# Patient Record
Sex: Female | Born: 1985 | Race: Black or African American | Hispanic: No | Marital: Married | Smoking: Never smoker
Health system: Southern US, Community
[De-identification: ages and names within clinical notes are randomized; demographics above are authoritative.]

## PROBLEM LIST (undated history)

## (undated) DIAGNOSIS — D649 Anemia, unspecified: Secondary | ICD-10-CM

## (undated) HISTORY — PX: DILATION AND CURETTAGE OF UTERUS: SHX78

---

## 2011-12-12 DIAGNOSIS — Z9889 Other specified postprocedural states: Secondary | ICD-10-CM

## 2012-05-20 HISTORY — PX: DILATION AND EVACUATION: SHX1459

## 2014-02-02 ENCOUNTER — Encounter (HOSPITAL_COMMUNITY): Payer: Self-pay | Admitting: Emergency Medicine

## 2014-02-02 ENCOUNTER — Emergency Department (INDEPENDENT_AMBULATORY_CARE_PROVIDER_SITE_OTHER)
Admission: EM | Admit: 2014-02-02 | Discharge: 2014-02-02 | Disposition: A | Discharge status: Home / Self Care | Payer: BC Managed Care – PPO | Source: Home / Self Care

## 2014-02-02 ENCOUNTER — Emergency Department (INDEPENDENT_AMBULATORY_CARE_PROVIDER_SITE_OTHER): Payer: BC Managed Care – PPO

## 2014-02-02 DIAGNOSIS — R0789 Other chest pain: Secondary | ICD-10-CM

## 2014-02-02 DIAGNOSIS — R079 Chest pain, unspecified: Secondary | ICD-10-CM

## 2014-02-02 DIAGNOSIS — K219 Gastro-esophageal reflux disease without esophagitis: Secondary | ICD-10-CM

## 2014-02-02 MED ORDER — GI COCKTAIL ~~LOC~~
ORAL | Status: AC
  Filled 2014-02-02: qty 30

## 2014-02-02 MED ORDER — GI COCKTAIL ~~LOC~~
30.0000 mL | Freq: Once | ORAL | Status: AC
  Administered 2014-02-02: 30 mL via ORAL

## 2014-02-02 MED ORDER — PREDNISONE 10 MG PO TABS: ORAL_TABLET | ORAL | Status: DC

## 2014-02-02 NOTE — ED Provider Notes (Signed)
CSN: [id number]     Arrival date & time 02/02/14  1952 History   None    Chief Complaint  Patient presents with  . Chest Pain     (Consider location/radiation/quality/duration/timing/severity/associated sxs/prior Treatment) HPI Comments: 28 yo AAF presents with Chest pain since this a.m. She notes pain is mid-sternal 5/10 without radiation. She notes occasional reflux and mild stress. She has been belching a little more and denies relief with TUMS.She denies HX of heart disease except murmur as a child that resolved. She denies Cardiovascular disease in family except HTN/ DM in father that is well controlled with Medication/ diet.   She notes initial improved symptoms with GI cocktail given at visit but after 10 minutes symptoms have returned.   Patient is a 28 y.o. female presenting with chest pain.  Chest Pain   History reviewed. No pertinent past medical history. History reviewed. No pertinent past surgical history. History reviewed. No pertinent family history. History  Substance Use Topics  . Smoking status: Never Smoker   . Smokeless tobacco: Not on file  . Alcohol Use: No   OB History   Grav Para Term Preterm Abortions TAB SAB Ect Mult Living                 Review of Systems  Cardiovascular: Positive for chest pain.  All other systems reviewed and are negative.      Allergies  Review of patient's allergies indicates no known allergies.  Home Medications   Current Outpatient Rx  Name  Route  Sig  Dispense  Refill  . doxycycline (VIBRA-TABS) 100 MG tablet   Oral   Take 200 mg by mouth 2 (two) times daily.         . ergocalciferol (VITAMIN D2) 50000 UNITS capsule   Oral   Take 50,000 Units by mouth once a week.         . phentermine 37.5 MG capsule   Oral   Take 37.5 mg by mouth every morning.          BP 130/82  Pulse 94  Temp(Src) 98 F (36.7 C) (Oral)  Resp 14  SpO2 99%  LMP 01/30/2014 Physical Exam  Nursing note and vitals  reviewed. Constitutional: She is oriented to person, place, and time. She appears well-developed and well-nourished.  HENT:  Head: Normocephalic and atraumatic.  Right Ear: External ear normal.  Left Ear: External ear normal.  Nose: Nose normal.  Mouth/Throat: Oropharynx is clear and moist.  Eyes: Conjunctivae and EOM are normal.  Neck: Normal range of motion.  Cardiovascular: Normal rate, regular rhythm, normal heart sounds and intact distal pulses.   Pulmonary/Chest: Effort normal and breath sounds normal. She exhibits tenderness.  Minimal anterior  Abdominal: Soft. Bowel sounds are normal. There is no tenderness.  Musculoskeletal: Normal range of motion.  Lymphadenopathy:    She has no cervical adenopathy.  Neurological: She is alert and oriented to person, place, and time.  Skin: Skin is warm and dry.  Psychiatric: She has a normal mood and affect. Judgment normal.    ED Course  Procedures (including critical care time) Labs Review Labs Reviewed - No data to display Imaging Review Dg Chest 2 View  02/02/2014   CLINICAL DATA:  Chest pain  EXAM: CHEST  2 VIEW  COMPARISON:  None.  FINDINGS: Cardiomediastinal silhouette is unremarkable. No acute infiltrate or pleural effusion. No pulmonary edema. Bony thorax is unremarkable.  IMPRESSION: No active cardiopulmonary disease.   Electronically Signed  By: Natasha Mead M.D.   On: 02/02/2014 21:09    EKG SINUS BRADY WNL RATE 59 PR 114 QRS 90 QT 434  MDM  Chest pain? Vs Reflux vs costochondritis- Bland diet explained. Prilosec OTC BID  While on Prednisone 10 mg DP AD. F/U PCP if no change, ER if increased Symptoms.    Berenice Primas, PA-C 02/02/14 2120

## 2014-02-02 NOTE — Discharge Instructions (Signed)
Chest Pain (Nonspecific) ER if symptoms increase Chest pain has many causes. Your pain could be caused by something serious, such as a heart attack or a blood clot in the lungs. It could also be caused by something less serious, such as a chest bruise or a virus. Follow up with your doctor. More lab tests or other studies may be needed to find the cause of your pain. Most of the time, nonspecific chest pain will improve within 2 to 3 days of rest and mild pain medicine. HOME CARE  For chest bruises, you may put ice on the sore area for 15-20 minutes, 03-04 times a day. Do this only if it makes you feel better.  Put ice in a plastic bag.  Place a towel between the skin and the bag.  Rest for the next 2 to 3 days.  Go back to work if the pain improves.  See your doctor if the pain lasts longer than 1 to 2 weeks.  Only take medicine as told by your doctor.  Quit smoking if you smoke. GET HELP RIGHT AWAY IF:   There is more pain or pain that spreads to the arm, neck, jaw, back, or belly (abdomen).  You have shortness of breath.  You cough more than usual or cough up blood.  You have very bad back or belly pain, feel sick to your stomach (nauseous), or throw up (vomit).  You have very bad weakness.  You pass out (faint).  You have a fever. Any of these problems may be serious and may be an emergency. Do not wait to see if the problems will go away. Get medical help right away. Call your local emergency services 911 in [place] Do not drive yourself to the hospital. MAKE SURE YOU:   Understand these instructions.  Will watch this condition.  Will get help right away if you or your child is not doing well or gets worse. Document Released: 05/15/2008 Document Revised: 02/19/2012 Document Reviewed: 05/15/2008 [facility] Patient Information 2014 [place], [place]. Diet for Gastroesophageal Reflux Disease, Adult Reflux (acid reflux) is when acid from your stomach flows up into the  esophagus. When acid comes in contact with the esophagus, the acid causes irritation and soreness (inflammation) in the esophagus. When reflux happens often or so severely that it causes damage to the esophagus, it is called gastroesophageal reflux disease (GERD). Nutrition therapy can help ease the discomfort of GERD. FOODS OR DRINKS TO AVOID OR LIMIT  Smoking or chewing tobacco. Nicotine is one of the most potent stimulants to acid production in the gastrointestinal tract.  Caffeinated and decaffeinated coffee and black tea.  Regular or low-calorie carbonated beverages or energy drinks (caffeine-free carbonated beverages are allowed).   Strong spices, such as black pepper, white pepper, red pepper, cayenne, curry powder, and chili powder.  Peppermint or spearmint.  Chocolate.  High-fat foods, including meats and fried foods. Extra added fats including oils, butter, salad dressings, and nuts. Limit these to less than 8 tsp per day.  Fruits and vegetables if they are not tolerated, such as citrus fruits or tomatoes.  Alcohol.  Any food that seems to aggravate your condition. If you have questions regarding your diet, call your caregiver or a registered dietitian. OTHER THINGS THAT MAY HELP GERD INCLUDE:   Eating your meals slowly, in a relaxed setting.  Eating 5 to 6 small meals per day instead of 3 large meals.  Eliminating food for a period of time if it causes distress.  Not lying down until 3 hours after eating a meal.  Keeping the head of your bed raised 6 to 9 inches (15 to 23 cm) by using a foam wedge or blocks under the legs of the bed. Lying flat may make symptoms worse.  Being physically active. Weight loss may be helpful in reducing reflux in overweight or obese adults.  Wear loose fitting clothing EXAMPLE MEAL PLAN This meal plan is approximately 2,000 calories based on https://www.bernard.org/ meal planning guidelines. Breakfast   cup cooked oatmeal.  1 cup  strawberries.  1 cup low-fat milk.  1 oz almonds. Snack  1 cup cucumber slices.  6 oz yogurt (made from low-fat or fat-free milk). Lunch  2 slice whole-wheat bread.  2 oz sliced [place].  2 tsp mayonnaise.  1 cup blueberries.  1 cup snap peas. Snack  6 whole-wheat crackers.  1 oz string cheese. Dinner   cup brown rice.  1 cup mixed veggies.  1 tsp olive oil.  3 oz grilled fish. Document Released: 11/27/2005 Document Revised: 02/19/2012 Document Reviewed: 10/13/2011 [facility] Patient Information 2014 [place], [place].

## 2014-02-02 NOTE — ED Notes (Addendum)
Prior to admin GI cocktail, pain level "5", states  no pain after administering , denies pain

## 2014-02-02 NOTE — ED Notes (Addendum)
Patient reports she woke at her usual time and was having pain in her chest and SOB Tried using tums , but no relief. Used baking soda which caused her to burp, but did not relieve discomfort. Pain not reproducible, no pain w direct palpation to touch , no pain w deep inspiration or ROM upper extremities. Pain mid chest w/o radiation . Denies n/v/sweats , feels as if she cannot get a good breath. Thinks she may have GERD, but never diagnosed. On phentermine for weight loss NAD, wd/color good

## 2014-02-03 NOTE — ED Provider Notes (Signed)
Medical screening examination/treatment/procedure(s) were performed by non-physician practitioner and as supervising physician I was immediately available for consultation/collaboration.  Leslee Homeavid Lalonnie Shaffer, M.D.  Reuben Likesavid C Tonyia Marschall, MD 02/03/14 [phone]

## 2014-03-27 ENCOUNTER — Emergency Department (HOSPITAL_COMMUNITY)
Admission: EM | Admit: 2014-03-27 | Discharge: 2014-03-27 | Disposition: A | Discharge status: Home / Self Care | Payer: BC Managed Care – PPO | Source: Home / Self Care | Attending: Family Medicine | Admitting: Family Medicine

## 2014-03-27 ENCOUNTER — Encounter (HOSPITAL_COMMUNITY): Payer: Self-pay | Admitting: Emergency Medicine

## 2014-03-27 DIAGNOSIS — S91119A Laceration without foreign body of unspecified toe without damage to nail, initial encounter: Secondary | ICD-10-CM

## 2014-03-27 DIAGNOSIS — S91109A Unspecified open wound of unspecified toe(s) without damage to nail, initial encounter: Secondary | ICD-10-CM

## 2014-03-27 MED ORDER — TETANUS-DIPHTH-ACELL PERTUSSIS 5-2.5-18.5 LF-MCG/0.5 IM SUSP
INTRAMUSCULAR | Status: AC
  Filled 2014-03-27: qty 0.5

## 2014-03-27 MED ORDER — TETANUS-DIPHTH-ACELL PERTUSSIS 5-2.5-18.5 LF-MCG/0.5 IM SUSP
0.5000 mL | Freq: Once | INTRAMUSCULAR | Status: AC
  Administered 2014-03-27: 0.5 mL via INTRAMUSCULAR

## 2014-03-27 NOTE — ED Provider Notes (Signed)
CSN: [id number]     Arrival date & time 03/27/14  1457 History   First MD Initiated Contact with Patient 03/27/14 1516     Chief Complaint  Patient presents with  . Laceration   (Consider location/radiation/quality/duration/timing/severity/associated sxs/prior Treatment) HPI  28 year old  f who presents for evaluation of a toe laceration. This occurred 3 hours ago. It is located on 4th digit of the left foot. This occurred when she struck her foot on the bed post at her home. She has tried pressure for management, which stopped the bleeding. Last tetanus shot unknown. She does not take any blood thinners.    History reviewed. No pertinent past medical history. History reviewed. No pertinent past surgical history. History reviewed. No pertinent family history. History  Substance Use Topics  . Smoking status: Never Smoker   . Smokeless tobacco: Not on file  . Alcohol Use: No   OB History   Grav Para Term Preterm Abortions TAB SAB Ect Mult Living                 Review of Systems No fever, chills, nausea, vomiting  Allergies  Review of patient's allergies indicates no known allergies.  Home Medications   Prior to Admission medications   Medication Sig Start Date End Date Taking? Authorizing Provider  doxycycline (VIBRA-TABS) 100 MG tablet Take 200 mg by mouth 2 (two) times daily.    Historical Provider, MD  ergocalciferol (VITAMIN D2) 50000 UNITS capsule Take 50,000 Units by mouth once a week.    Historical Provider, MD  phentermine 37.5 MG capsule Take 37.5 mg by mouth every morning.    Historical Provider, MD  predniSONE (DELTASONE) 10 MG tablet 1 po TID x 3 days, 1 PO BID x 3 days, 1 po QD x 5 days 02/02/14   Berenice PrimasMelissa R Smith, PA-C   BP 118/77  Pulse 80  Temp(Src) 98.2 F (36.8 C) (Oral)  Resp 16  SpO2 100%  LMP 03/17/2014 Physical Exam Gen: well appearing adult female, non distressed, pleasant  Skin: 1 cm laceration on the medial side of the 4th digit of the left foot  with active bleeding Toes: left foot with normal flexion and extension of toes Neuro: sensation intact to all digits  ED Course  Procedures (including critical care time) Labs Review Labs Reviewed - No data to display  No results found for this or any previous visit. Imaging Review No results found.   MDM   1. Toe laceration    Procedure Note:    There is a curved laceration measuring approximately 1.5 cm in length on the medial 4th digit of left foot. Examination of the wound for foreign bodies and devitalized tissue showed none.  Examination of the surrounding area for neural or vascular damage showed none.   Verbal consent obtained. Area cleaned with sterilizing spray and sterile water. Anesthestized with 5 ml of 2% lidocaine without epinephrine. 5 simple interrupted sutures placed using 4-0 prolene. No complications. Patient tolerated procedure well and given instructions for follow up.      Garnetta BuddyEdward V Omayra Tulloch, MD 03/27/14 [phone]

## 2014-03-27 NOTE — Discharge Instructions (Signed)
Oswaldo DoneAquina,   Nice to meet you. Sorry about the toe injury. It should heal just fine. Please wear closed toed shoes for a few days. You may wash in the shower with soap, but try to keep it dry other time. If you start to have drainage or swelling or fevers, then please come back. Otherwise, come back next Wednesday for suture removal.   Take Care,   Dr. Clinton SawyerWilliamson

## 2014-03-27 NOTE — ED Notes (Signed)
C/o left four digit toe laceration See physician note

## 2014-03-28 NOTE — ED Provider Notes (Signed)
Medical screening examination/treatment/procedure(s) were performed by a resident physician or non-physician practitioner and as the supervising physician I was immediately available for consultation/collaboration.  Clementeen GrahamEvan Asencion Guisinger, MD    Rodolph BongEvan S Estalene Bergey, MD 03/28/14 [phone]

## 2014-07-30 ENCOUNTER — Encounter: Payer: BC Managed Care – PPO | Admitting: Obstetrics and Gynecology

## 2014-08-19 ENCOUNTER — Encounter: Payer: Self-pay | Admitting: Obstetrics and Gynecology

## 2014-08-19 ENCOUNTER — Encounter (INDEPENDENT_AMBULATORY_CARE_PROVIDER_SITE_OTHER): Payer: Self-pay

## 2014-08-19 ENCOUNTER — Ambulatory Visit (INDEPENDENT_AMBULATORY_CARE_PROVIDER_SITE_OTHER): Payer: BC Managed Care – PPO | Admitting: Obstetrics and Gynecology

## 2014-08-19 VITALS — BP 105/69 | HR 78 | Ht 68.0 in | Wt 208.0 lb

## 2014-08-19 DIAGNOSIS — R87612 Low grade squamous intraepithelial lesion on cytologic smear of cervix (LGSIL): Secondary | ICD-10-CM | POA: Diagnosis not present

## 2014-08-19 HISTORY — DX: Low grade squamous intraepithelial lesion on cytologic smear of cervix (LGSIL): R87.612

## 2014-08-19 NOTE — Progress Notes (Signed)
Patient ID: Anne Clark, female   DOB: December 11, 1986, 28 y.o.   MRN: [id number] 07/24/2014 pap smear LGSIL Patient given informed consent, signed copy in the chart, time out was performed.  Placed in lithotomy position. Cervix viewed with speculum and colposcope after application of acetic acid.   Colposcopy adequate?  No TZ not visualized Acetowhite lesions?yes 12 and 4 o'clock Punctation?no Mosaicism?  no Abnormal vasculature?  no Biopsies?yes 12 and 4 o'clock ECC?yes  COMMENTS: Patient was given post procedure instructions.  She will return in 2 weeks for results.

## 2014-08-20 ENCOUNTER — Encounter: Payer: BC Managed Care – PPO | Admitting: Obstetrics & Gynecology

## 2014-08-25 ENCOUNTER — Telehealth: Payer: Self-pay | Admitting: *Deleted

## 2014-08-25 NOTE — Telephone Encounter (Signed)
Pt aware of results. Pt will call back to schedule appt for repeat pap smear.

## 2014-08-25 NOTE — Telephone Encounter (Signed)
Message copied by Grayland Ormond on Tue Aug 25, 2014  9:54 AM ------      Message from: CONSTANT, Gigi Gin      Created: Tue Aug 25, 2014  9:09 AM       Please inform patient of colpo results consistent with pap smear. RTC in 6 months for repeat pap smear            Thanks            Peggy ------

## 2014-08-31 ENCOUNTER — Telehealth: Payer: Self-pay | Admitting: *Deleted

## 2014-08-31 NOTE — Telephone Encounter (Signed)
Left message for patient to call us back regarding her test results. 

## 2014-08-31 NOTE — Telephone Encounter (Signed)
Message copied by Barbara Cower on Mon Aug 31, 2014 10:39 AM ------      Message from: CONSTANT, Gigi Gin      Created: Tue Aug 25, 2014  9:09 AM       Please inform patient of colpo results consistent with pap smear. RTC in 6 months for repeat pap smear            Thanks            Peggy ------

## 2015-04-08 ENCOUNTER — Encounter (HOSPITAL_COMMUNITY): Payer: Self-pay | Admitting: *Deleted

## 2015-04-08 ENCOUNTER — Emergency Department (HOSPITAL_COMMUNITY)
Admission: EM | Admit: 2015-04-08 | Discharge: 2015-04-08 | Disposition: A | Discharge status: Home / Self Care | Payer: BLUE CROSS/BLUE SHIELD | Source: Home / Self Care | Attending: Emergency Medicine | Admitting: Emergency Medicine

## 2015-04-08 DIAGNOSIS — J309 Allergic rhinitis, unspecified: Secondary | ICD-10-CM | POA: Diagnosis not present

## 2015-04-08 LAB — POCT RAPID STREP A: STREPTOCOCCUS, GROUP A SCREEN (DIRECT): NEGATIVE

## 2015-04-08 MED ORDER — FLUTICASONE PROPIONATE 50 MCG/ACT NA SUSP
2.0000 | Freq: Two times a day (BID) | NASAL | Status: DC
Start: 2015-04-08 — End: 2015-11-25

## 2015-04-08 MED ORDER — CETIRIZINE HCL 10 MG PO TABS: 10.0000 mg | ORAL_TABLET | Freq: Every day | ORAL | Status: DC

## 2015-04-08 MED ORDER — METHYLPREDNISOLONE 4 MG PO TBPK: ORAL_TABLET | ORAL | Status: DC

## 2015-04-08 NOTE — ED Notes (Signed)
Pt   Reports     sorethroat      Symptoms        X      9  Days   With      Congestion/  Cough             Pt   Reports   Symptoms  Not  releived  By otc  meds      Sitting  Upright on the  Exam table  Speaking in  Complete  sentances

## 2015-04-08 NOTE — Discharge Instructions (Signed)

## 2015-04-08 NOTE — ED Provider Notes (Signed)
CSN: [id number]     Arrival date & time 04/08/15  0946 History   First MD Initiated Contact with Patient 04/08/15 [phone]     Chief Complaint  Patient presents with  . Sore Throat   (Consider location/radiation/quality/duration/timing/severity/associated sxs/prior Treatment) HPI     29 year old female presents for evaluation of sore throat, congestion, rhinorrhea, sneezing, itching throat, watering and itching eyes. Her symptoms began about 10 days ago. She has been taking over-the-counter medications with minimal relief. She denies sinus pressure or pain or any headache. She has no history of seasonal allergies. No fevers  History reviewed. No pertinent past medical history. Past Surgical History  Procedure Laterality Date  . Dilation and evacuation  05/20/2012    missed ab   Family History  Problem Relation Age of Onset  . Diabetes Father   . Hypertension Father    History  Substance Use Topics  . Smoking status: Never Smoker   . Smokeless tobacco: Never Used  . Alcohol Use: 0.5 oz/week    1 drink(s) per week   OB History    No data available     Review of Systems  Constitutional: Negative for fever and chills.  HENT: Positive for congestion, postnasal drip, rhinorrhea and sore throat. Negative for dental problem, ear pain, facial swelling and sinus pressure.   Eyes: Positive for discharge and itching. Negative for pain and redness.  Respiratory: Positive for cough. Negative for shortness of breath.   Cardiovascular: Negative for chest pain.  All other systems reviewed and are negative.   Allergies  Review of patient's allergies indicates no known allergies.  Home Medications   Prior to Admission medications   Medication Sig Start Date End Date Taking? Authorizing Provider  cetirizine (ZYRTEC) 10 MG tablet Take 1 tablet (10 mg total) by mouth daily. 04/08/15   Graylon Good, PA-C  ergocalciferol (VITAMIN D2) 50000 UNITS capsule Take 50,000 Units by mouth once a week.     Historical Provider, MD  fluticasone (FLONASE) 50 MCG/ACT nasal spray Place 2 sprays into both nostrils 2 (two) times daily. Decrease to 2 sprays/nostril daily after 5 days 04/08/15   Graylon Good, PA-C  methylPREDNISolone (MEDROL DOSEPAK) 4 MG TBPK tablet follow package directions 04/08/15   Graylon Good, PA-C  Multiple Vitamins-Minerals (MULTIVITAMIN WITH MINERALS) tablet Take 1 tablet by mouth daily.    Historical Provider, MD   BP 116/76 mmHg  Pulse 73  Temp(Src) 98.6 F (37 C) (Oral)  Resp 18  SpO2 100% Physical Exam  Constitutional: She is oriented to person, place, and time. Vital signs are normal. She appears well-developed and well-nourished. No distress.  HENT:  Head: Normocephalic and atraumatic.  Right Ear: Tympanic membrane, external ear and ear canal normal.  Left Ear: Tympanic membrane, external ear and ear canal normal.  Nose: Mucosal edema present. Right sinus exhibits no maxillary sinus tenderness and no frontal sinus tenderness. Left sinus exhibits no maxillary sinus tenderness and no frontal sinus tenderness.  Mouth/Throat: Mucous membranes are normal. Abnormal dentition.  Cobblestoning of the posterior oropharynx  Eyes: Conjunctivae are normal.  Neck: Normal range of motion. Neck supple.  Cardiovascular: Normal rate, regular rhythm and normal heart sounds.   Pulmonary/Chest: Effort normal and breath sounds normal. No respiratory distress.  Lymphadenopathy:    She has no cervical adenopathy.  Neurological: She is alert and oriented to person, place, and time. She has normal strength. Coordination normal.  Skin: Skin is warm and dry. No rash noted. She is  not diaphoretic.  Psychiatric: She has a normal mood and affect. Judgment normal.  Nursing note and vitals reviewed.   ED Course  Procedures (including critical care time) Labs Review Labs Reviewed - No data to display  Imaging Review No results found.   MDM   1. Allergic rhinitis, unspecified  allergic rhinitis type    No red flags, no signs of any bacterial infection or sinusitis. Treat with Medrol Dosepak, start daily Zyrtec and Flonase. Follow-up if no improvement in a week  Meds ordered this encounter  Medications  . methylPREDNISolone (MEDROL DOSEPAK) 4 MG TBPK tablet    Sig: follow package directions    Dispense:  21 tablet    Refill:  0  . cetirizine (ZYRTEC) 10 MG tablet    Sig: Take 1 tablet (10 mg total) by mouth daily.    Dispense:  30 tablet    Refill:  2  . fluticasone (FLONASE) 50 MCG/ACT nasal spray    Sig: Place 2 sprays into both nostrils 2 (two) times daily. Decrease to 2 sprays/nostril daily after 5 days    Dispense:  16 g    Refill:  2       Graylon GoodZachary H Maybelle Depaoli, PA-C 04/08/15 1040

## 2015-04-10 LAB — CULTURE, GROUP A STREP: Strep A Culture: NEGATIVE

## 2015-08-25 ENCOUNTER — Ambulatory Visit: Payer: BLUE CROSS/BLUE SHIELD | Admitting: Obstetrics and Gynecology

## 2015-11-25 ENCOUNTER — Ambulatory Visit (INDEPENDENT_AMBULATORY_CARE_PROVIDER_SITE_OTHER): Payer: BLUE CROSS/BLUE SHIELD | Admitting: Obstetrics and Gynecology

## 2015-11-25 ENCOUNTER — Encounter: Payer: Self-pay | Admitting: Obstetrics and Gynecology

## 2015-11-25 VITALS — BP 112/72 | HR 94 | Ht 69.0 in | Wt 221.0 lb

## 2015-11-25 DIAGNOSIS — Z01419 Encounter for gynecological examination (general) (routine) without abnormal findings: Secondary | ICD-10-CM | POA: Diagnosis not present

## 2015-11-25 DIAGNOSIS — Z124 Encounter for screening for malignant neoplasm of cervix: Secondary | ICD-10-CM

## 2015-11-25 DIAGNOSIS — Z1151 Encounter for screening for human papillomavirus (HPV): Secondary | ICD-10-CM

## 2015-11-25 NOTE — Progress Notes (Signed)
  Subjective:     Anne Clark is a 29 y.o. female G0 with BMI 32 who is here for a comprehensive physical exam. The patient reports no problems. She is sexually active without contraception and would welcome a pregnancy if it occurred. Patient has normal monthly cycles. She denies any abnormal pelvic pain.  History reviewed. No pertinent past medical history. Past Surgical History  Procedure Laterality Date  . Dilation and evacuation  05/20/2012    missed ab   Family History  Problem Relation Age of Onset  . Diabetes Father   . Hypertension Father     Social History   Social History  . Marital Status: Single    Spouse Name: N/A  . Number of Children: N/A  . Years of Education: N/A   Occupational History  . Not on file.   Social History Main Topics  . Smoking status: Never Smoker   . Smokeless tobacco: Never Used  . Alcohol Use: 0.5 oz/week    1 drink(s) per week  . Drug Use: No  . Sexual Activity:    Partners: Male    Birth Control/ Protection: Condom, Rhythm   Other Topics Concern  . Not on file   Social History Narrative   Health Maintenance  Topic Date Due  . HIV Screening  10/25/2000  . PAP SMEAR  10/25/2006  . INFLUENZA VACCINE  07/12/2015  . TETANUS/TDAP  03/27/2024       Review of Systems Pertinent items are noted in HPI.   Objective:      GENERAL: Well-developed, well-nourished female in no acute distress.  HEENT: Normocephalic, atraumatic. Sclerae anicteric.  NECK: Supple. Normal thyroid.  LUNGS: Clear to auscultation bilaterally.  HEART: Regular rate and rhythm. BREASTS: Symmetric in size. No palpable masses or lymphadenopathy, skin changes, or nipple drainage. ABDOMEN: Soft, nontender, nondistended. No organomegaly. PELVIC: Normal external female genitalia. Vagina is pink and rugated.  Normal discharge. Normal appearing cervix. Uterus is normal in size. No adnexal mass or tenderness. EXTREMITIES: No cyanosis, clubbing, or edema, 2+  distal pulses.    Assessment:    Healthy female exam.      Plan:    pap smear collected Patient advised to perform monthly self breast exam Patient will be contacted with any abnormal results Patient encouraged to take prenatal vitamins preconception See After Visit Summary for Counseling Recommendations

## 2015-11-26 LAB — CYTOLOGY - PAP

## 2016-02-01 ENCOUNTER — Other Ambulatory Visit: Payer: BLUE CROSS/BLUE SHIELD

## 2016-02-10 ENCOUNTER — Other Ambulatory Visit (INDEPENDENT_AMBULATORY_CARE_PROVIDER_SITE_OTHER): Payer: BLUE CROSS/BLUE SHIELD | Admitting: *Deleted

## 2016-02-10 DIAGNOSIS — R300 Dysuria: Secondary | ICD-10-CM | POA: Diagnosis not present

## 2016-02-10 DIAGNOSIS — Z3202 Encounter for pregnancy test, result negative: Secondary | ICD-10-CM | POA: Diagnosis not present

## 2016-02-10 LAB — POCT URINALYSIS DIPSTICK
BILIRUBIN UA: NEGATIVE
GLUCOSE UA: NEGATIVE
Ketones, UA: NEGATIVE
NITRITE UA: NEGATIVE
Protein, UA: NEGATIVE
SPEC GRAV UA: 1.02
Urobilinogen, UA: 0.2
pH, UA: 6.5

## 2016-02-10 LAB — POCT URINE PREGNANCY: Preg Test, Ur: NEGATIVE

## 2016-02-10 MED ORDER — SULFAMETHOXAZOLE-TRIMETHOPRIM 800-160 MG PO TABS
1.0000 | ORAL_TABLET | Freq: Two times a day (BID) | ORAL | Status: DC
Start: 2016-02-10 — End: 2020-12-30

## 2016-02-10 MED ORDER — FLAVOXATE HCL 100 MG PO TABS: 100.0000 mg | ORAL_TABLET | Freq: Three times a day (TID) | ORAL | Status: DC | PRN

## 2016-02-10 NOTE — Progress Notes (Signed)
SUBJECTIVE: Anne Clark is a 30 y.o. female who complains of urinary frequency, urgency and dysuria x  2 weeks, with flank pain, without fever, chills, abnormal vaginal discharge or bleeding. Pt states she has been trying to conceive and menstrual cycle is due in 3 days.   OBJECTIVE: Appears well, in no apparent distress.  Vital signs are normal. Urine dipstick shows positive for WBC's and moderate blood.  Micro exam: not done. UPT: Negative today  PLAN: Treatment per verbal orders from Nicholaus Bloom, MD - also advised push fluids, may use Urispas for urinary spasms. Call or return to clinic prn if these symptoms worsen or fail to improve as anticipated. Will send urine for culture

## 2016-02-13 LAB — CULTURE, URINE COMPREHENSIVE: Colony Count: >=100000

## 2018-04-29 ENCOUNTER — Other Ambulatory Visit: Payer: Self-pay | Admitting: Obstetrics and Gynecology

## 2018-04-29 ENCOUNTER — Other Ambulatory Visit (HOSPITAL_COMMUNITY)
Admission: RE | Admit: 2018-04-29 | Discharge: 2018-04-29 | Disposition: A | Discharge status: Home / Self Care | Payer: BLUE CROSS/BLUE SHIELD | Source: Ambulatory Visit | Attending: Obstetrics and Gynecology | Admitting: Obstetrics and Gynecology

## 2018-04-29 DIAGNOSIS — Z01419 Encounter for gynecological examination (general) (routine) without abnormal findings: Secondary | ICD-10-CM | POA: Diagnosis not present

## 2018-04-30 LAB — CYTOLOGY - PAP
Diagnosis: NEGATIVE
HPV (WINDOPATH): NOT DETECTED

## 2020-12-11 NOTE — L&D Delivery Note (Addendum)
Delivery Note  Called in room to see patient, reports pelvic pressure and the urge to push: SVE: 10/100/+2, vertex, BBOW.   Encouraged position change in tub, patient able to change position without assistant.   Effective coached maternal pushing efforts.   Spontaneous vaginal birth of liveborn female infant in right occiput anterior position under water at 1430. Infant immediately to surface and placed on maternal abdomen skin to skin. Cord double clamped and cut by FOB after eight (8) minutes. Infant placed skin to skin with FOB.   Patient transfer from tub to labor bed at 1440 with minimal assistance.   Spontaneous delivery of intact placenta at 1442. Uterus firm. Rubra small. EBL: 150 ml. Encouraged to empty bladder. Perineal skidmark hemostatic, unrepaired.    Initiate routine postpartum care and orders. Mom to postpartum.  Baby to Couplet care / Skin to Skin.  Time in tub: 1315  Time out of tub: 1440  FOB and his mother present at bedside and overjoyed with the birth of "Abjah II".    Serafina Royals, CNM [facility], [facility] 05/18/2021, 3:29 PM

## 2020-12-24 ENCOUNTER — Telehealth: Payer: Self-pay

## 2020-12-24 NOTE — Telephone Encounter (Signed)
Please contact patient to assesses bleeding. May have earlier appointment with different provider if desired. Thanks, JML

## 2020-12-24 NOTE — Telephone Encounter (Signed)
Pt called in and stated that she is coming in for first visit on the 21st. The pt stated that she is bleeding, the pt said that she has been bleeding when she wipes and that its been for the past two days. The pt said that it is brown with some discharge. The pt was requesting to be seen sooner than the 21st.  Pt stated that she has some pressure.I told the pt I will send a message to the provider.  Please advise

## 2020-12-24 NOTE — Telephone Encounter (Signed)
Spoke with patient and she has had a yeast infection. She inserted the tube that the medication was into the vagina. I explained that could have caused some of the spotting. I told her that if she starts to have bright red bleeding and cramping to go to the ED over the weekend. If she need to she can call back on Monday and we will try to get her in with another provider that is in the office if we are open. Patient verbalized understanding.

## 2020-12-30 ENCOUNTER — Encounter: Payer: Self-pay | Admitting: Certified Nurse Midwife

## 2020-12-30 ENCOUNTER — Other Ambulatory Visit: Payer: Self-pay

## 2020-12-30 ENCOUNTER — Ambulatory Visit (INDEPENDENT_AMBULATORY_CARE_PROVIDER_SITE_OTHER): Payer: BC Managed Care – PPO | Admitting: Certified Nurse Midwife

## 2020-12-30 VITALS — BP 96/48 | HR 116 | Ht 69.0 in | Wt 250.0 lb

## 2020-12-30 DIAGNOSIS — Z3482 Encounter for supervision of other normal pregnancy, second trimester: Secondary | ICD-10-CM

## 2020-12-30 DIAGNOSIS — Z3A17 17 weeks gestation of pregnancy: Secondary | ICD-10-CM

## 2020-12-30 NOTE — Patient Instructions (Addendum)
WHAT OB PATIENTS CAN EXPECT   Confirmation of pregnancy and ultrasound ordered if medically indicated-[redacted] weeks gestation  New OB (NOB) intake with nurse and New OB (NOB) labs- [redacted] weeks gestation  New OB (NOB) physical examination with provider- 11/[redacted] weeks gestation  Flu vaccine-[redacted] weeks gestation  Anatomy scan-[redacted] weeks gestation  Glucose tolerance test, blood work to test for anemia, T-dap vaccine-[redacted] weeks gestation  Vaginal swabs/cultures-STD/Group B strep-[redacted] weeks gestation  Appointments every 4 weeks until 28 weeks  Every 2 weeks from 28 weeks until 36 weeks  Weekly visits from 36 weeks until delivery  Second Trimester of Pregnancy  The second trimester of pregnancy is from week 13 through week 27. This is also called months 4 through 6 of pregnancy. This is often the time when you feel your best. During the second trimester:  Morning sickness is less or has stopped.  You may have more energy.  You may feel hungry more often. At this time, your unborn baby (fetus) is growing very fast. At the end of the sixth month, the unborn baby may be up to 12 inches long and weigh about 1 pounds. You will likely start to feel the baby move between 16 and 20 weeks of pregnancy. Body changes during your second trimester Your body continues to go through many changes during this time. The changes vary and generally return to normal after the baby is born. Physical changes  You will gain more weight.  You may start to get stretch marks on your hips, belly (abdomen), and breasts.  Your breasts will grow and may hurt.  Dark spots or blotches may develop on your face.  A dark line from your belly button to the pubic area (linea nigra) may appear.  You may have changes in your hair. Health changes  You may have headaches.  You may have heartburn.  You may have trouble pooping (constipation).  You may have hemorrhoids or swollen, bulging veins (varicose veins).  Your gums may  bleed.  You may pee (urinate) more often.  You may have back pain. Follow these instructions at home: Medicines  Take over-the-counter and prescription medicines only as told by your doctor. Some medicines are not safe during pregnancy.  Take a prenatal vitamin that contains at least 600 micrograms (mcg) of folic acid. Eating and drinking  Eat healthy meals that include: ? Fresh fruits and vegetables. ? Whole grains. ? Good sources of protein, such as meat, eggs, or tofu. ? Low-fat dairy products.  Avoid raw meat and unpasteurized juice, milk, and cheese.  You may need to take these actions to prevent or treat trouble pooping: ? Drink enough fluids to keep your pee (urine) pale yellow. ? Eat foods that are high in fiber. These include beans, whole grains, and fresh fruits and vegetables. ? Limit foods that are high in fat and sugar. These include fried or sweet foods. Activity  Exercise only as told by your doctor. Most people can do their usual exercise during pregnancy. Try to exercise for 30 minutes at least 5 days a week.  Stop exercising if you have pain or cramps in your belly or lower back.  Do not exercise if it is too hot or too humid, or if you are in a place of great height (high altitude).  Avoid heavy lifting.  If you choose to, you may have sex unless your doctor tells you not to. Relieving pain and discomfort  Wear a good support bra if your breasts   your breasts are sore.  Take warm water baths (sitz baths) to soothe pain or discomfort caused by hemorrhoids. Use hemorrhoid cream if your doctor approves.  Rest with your legs raised (elevated) if you have leg cramps or low back pain.  If you develop bulging veins in your legs: ? Wear support hose as told by your doctor. ? Raise your feet for 15 minutes, 3-4 times a day. ? Limit salt in your food. Safety  Wear your seat belt at all times when you are in a car.  Talk with your doctor if someone is hurting you or  yelling at you a lot. Lifestyle  Do not use hot tubs, steam rooms, or saunas.  Do not douche. Do not use tampons or scented sanitary pads.  Avoid cat litter boxes and soil used by cats. These carry germs that can harm your baby and can cause a loss of your baby by miscarriage or stillbirth.  Do not use herbal medicines, illegal drugs, or medicines that are not approved by your doctor. Do not drink alcohol.  Do not smoke or use any products that contain nicotine or tobacco. If you need help quitting, ask your doctor. General instructions  Keep all follow-up visits. This is important.  Ask your doctor about local prenatal classes.  Ask your doctor about the right foods to eat or for help finding a counselor. Where to find more information  American Pregnancy Association: americanpregnancy.org  [employer]: www.acog.org  Office on [employer] [facility]: [employer] Contact a doctor if:  You have a headache that does not go away when you take medicine.  You have changes in how you see, or you see spots in front of your eyes.  You have mild cramps, pressure, or pain in your lower belly.  You continue to feel like you may vomit (nauseous), you vomit, or you have watery poop (diarrhea).  You have bad-smelling fluid coming from your vagina.  You have pain when you pee or your pee smells bad.  You have very bad swelling of your face, hands, ankles, feet, or legs.  You have a fever. Get help right away if:  You are leaking fluid from your vagina.  You have spotting or bleeding from your vagina.  You have very bad belly cramping or pain.  You have trouble breathing.  You have chest pain.  You faint.  You have not felt your baby move for the time period told by your doctor.  You have new or increased pain, swelling, or redness in an arm or leg. Summary  The second trimester of pregnancy is from week 13 through week 27  (months 4 through 6).  Eat healthy meals.  Exercise as told by your doctor. Most people can do their usual exercise during pregnancy.  Do not use herbal medicines, illegal drugs, or medicines that are not approved by your doctor. Do not drink alcohol.  Call your doctor if you get sick or if you notice anything unusual about your pregnancy. This information is not intended to replace advice given to you by your health care provider. Make sure you discuss any questions you have with your health care provider. Document Revised: 05/05/2020 Document Reviewed: 03/11/2020 Elsevier Patient Education  2021 [employer].

## 2020-12-30 NOTE — Progress Notes (Signed)
I have seen, interviewed, and examined the patient in conjunction with the [employer] and affirm the diagnosis and management plan.   Gunnar Bulla, CNM [facility], [facility] 12/30/20 3:00 PM

## 2020-12-30 NOTE — Progress Notes (Signed)
NEW OB HISTORY AND PHYSICAL  SUBJECTIVE:       Anne Clark is a 35 y.o. [id number] female, Patient's last menstrual period was 08/29/2020 (exact date)., Estimated Date of Delivery: 06/09/21, [redacted]w[redacted]d, presents today for establishment of Prenatal Care.  Endorses spotting with wiping after completing treatment for vaginal yeast infection.   Used progesterone through first trimester.   Denies difficulty breathing, respiratory distress, chest pain, and leg swelling or pain.  Genetic screening completed at [facility].    Gynecologic History  Patient's last menstrual period was 08/29/2020 (exact date).  Contraception: none, Pregnant  Last Pap:05/2020. Results were: Neg/Neg; remote history LSIL  Obstetric History  OB History  Gravida Para Term Preterm AB Living  5 2     2 2   SAB IAB Ectopic Multiple Live Births  2       2    # Outcome Date GA Lbr Len/2nd Weight Sex Delivery Anes PTL Lv  5 Current           4 Para 03/12/19 [redacted]w[redacted]d  2948 g F  None N LIV  3 Para 04/02/17 [redacted]w[redacted]d  3232 g F Vag-Spont Other N LIV  2 SAB 2015 [redacted]w[redacted]d         1 SAB 2013 [redacted]w[redacted]d            Complications: History of D&C    No past medical history on file.  Past Surgical History:  Procedure Laterality Date  . DILATION AND CURETTAGE OF UTERUS    . DILATION AND EVACUATION  05/20/2012   missed ab    Current Outpatient Medications on File Prior to Visit  Medication Sig Dispense Refill  . ergocalciferol (VITAMIN D2) 50000 UNITS capsule Take 50,000 Units by mouth once a week.    . Prenatal Vit-Fe Fumarate-FA (PRENATAL MULTIVITAMIN) TABS tablet Take 1 tablet by mouth daily at 12 noon.    07/20/2012 terconazole (TERAZOL 7) 0.4 % vaginal cream Place 1 applicator vaginally at bedtime.     No current facility-administered medications on file prior to visit.    No Known Allergies  Social History   Socioeconomic History  . Marital status: Single    Spouse name: Not on file  . Number of children: Not on file  .  Years of education: Not on file  . Highest education level: Not on file  Occupational History  . Not on file  Tobacco Use  . Smoking status: Never Smoker  . Smokeless tobacco: Never Used  Substance and Sexual Activity  . Alcohol use: Not Currently    Alcohol/week: 1.0 standard drink    Types: 1 Standard drinks or equivalent per week  . Drug use: No  . Sexual activity: Yes    Partners: Male    Birth control/protection: None  Other Topics Concern  . Not on file  Social History Narrative  . Not on file   Social Determinants of Health   Financial Resource Strain: Not on file  Food Insecurity: Not on file  Transportation Needs: Not on file  Physical Activity: Not on file  Stress: Not on file  Social Connections: Not on file  Intimate Partner Violence: Not on file    Family History  Problem Relation Age of Onset  . Diabetes Father   . Hypertension Father   . Breast cancer Paternal Aunt     The following portions of the patient's history were reviewed and updated as appropriate: allergies, current medications, past OB history, past medical history, past surgical history,  past family history, past social history, and problem list.  Review of Systems:  ROS negative except as noted above. Information obtained from patient.  OBJECTIVE:  BP (!) 96/48   Pulse (!) 116   Ht 5\' 9"  (1.753 m)   Wt 113.4 kg   LMP 08/29/2020 (Exact Date)   BMI 36.92 kg/m    Initial Physical Exam (New OB)  CONSTITUTIONAL: Well-developed, well-nourished female in no acute distress.   PSYCHIATRIC: Normal mood and affect. Normal behavior. Normal judgment and thought content.  NEUROLGIC: Alert and oriented to person, place, and time. Normal muscle tone coordination. No cranial nerve deficit noted.  EYES: Conjunctivae and EOM are normal.   NECK: Normal range of motion, supple, no masses.   SKIN: Skin is warm and dry. No rash noted. Not diaphoretic. No erythema. No pallor.  CARDIOVASCULAR:  Normal heart rate noted, regular rhythm, no murmur.  RESPIRATORY: Clear to auscultation bilaterally. Effort and breath sounds normal, no problems with respiration noted.  ABDOMEN: Soft, normal bowel sounds, no distention noted.  No tenderness, rebound or guarding. 17 week uterus noted.  PELVIC: Deferred until postpartum    MUSCULOSKELETAL: Normal range of motion. No tenderness.  No cyanosis, clubbing, or edema.     ASSESSMENT:  Normal pregnancy  PLAN:  New OB labs today, See orders Encouraged patient to increase water intake to help decrease cramping. Anticipatory guidance regarding course of prenatal care. Reviewed red flag symptoms and when to call.  RTC x 4 weeks for ANNATOMY ULTRASOUND and ROB with ANNIE or sooner if needed.  08/31/2020, Student-MidWife  [employer] 12/30/20 10:59 AM

## 2020-12-31 ENCOUNTER — Encounter: Payer: Self-pay | Admitting: Certified Nurse Midwife

## 2020-12-31 LAB — CBC WITH DIFFERENTIAL
Basophils Absolute: 0 10*3/uL (ref 0.0–0.2)
Basos: 0 %
EOS (ABSOLUTE): 0.2 10*3/uL (ref 0.0–0.4)
Eos: 1 %
Hematocrit: 37.9 % (ref 34.0–46.6)
Hemoglobin: 13 g/dL (ref 11.1–15.9)
Immature Grans (Abs): 0 10*3/uL (ref 0.0–0.1)
Immature Granulocytes: 0 %
Lymphocytes Absolute: 2.4 10*3/uL (ref 0.7–3.1)
Lymphs: 19 %
MCH: 28.4 pg (ref 26.6–33.0)
MCHC: 34.3 g/dL (ref 31.5–35.7)
MCV: 83 fL (ref 79–97)
Monocytes Absolute: 0.7 10*3/uL (ref 0.1–0.9)
Monocytes: 5 %
Neutrophils Absolute: 9.6 10*3/uL — ABNORMAL HIGH (ref 1.4–7.0)
Neutrophils: 75 %
RBC: 4.57 x10E6/uL (ref 3.77–5.28)
RDW: 11.9 % (ref 11.7–15.4)
WBC: 13 10*3/uL — ABNORMAL HIGH (ref 3.4–10.8)

## 2020-12-31 LAB — HEMOGLOBIN A1C
Est. average glucose Bld gHb Est-mCnc: 114 mg/dL
Hgb A1c MFr Bld: 5.6 % (ref 4.8–5.6)

## 2020-12-31 LAB — URINALYSIS, ROUTINE W REFLEX MICROSCOPIC
Bilirubin, UA: NEGATIVE
Glucose, UA: NEGATIVE
Leukocytes,UA: NEGATIVE
Nitrite, UA: NEGATIVE
RBC, UA: NEGATIVE
Specific Gravity, UA: 1.026 (ref 1.005–1.030)
Urobilinogen, Ur: 0.2 mg/dL (ref 0.2–1.0)
pH, UA: 6.5 (ref 5.0–7.5)

## 2020-12-31 LAB — VIRAL HEPATITIS HBV, HCV
HCV Ab: <0.1 s/co ratio (ref 0.0–0.9)
Hep B Core Total Ab: NEGATIVE
Hep B Surface Ab, Qual: NONREACTIVE
Hepatitis B Surface Ag: NEGATIVE

## 2020-12-31 LAB — RUBELLA SCREEN: Rubella Antibodies, IGG: 2.28 index (ref >0.99)

## 2020-12-31 LAB — ANTIBODY SCREEN: Antibody Screen: NEGATIVE

## 2020-12-31 LAB — HCV INTERPRETATION

## 2020-12-31 LAB — ABO AND RH: Rh Factor: POSITIVE

## 2020-12-31 LAB — TSH: TSH: 0.581 u[IU]/mL (ref 0.450–4.500)

## 2020-12-31 LAB — HIV ANTIBODY (ROUTINE TESTING W REFLEX): HIV Screen 4th Generation wRfx: NONREACTIVE

## 2020-12-31 LAB — HGB SOLU + RFLX FRAC: Sickle Solubility Test - HGBRFX: NEGATIVE

## 2020-12-31 LAB — RPR: RPR Ser Ql: NONREACTIVE

## 2020-12-31 LAB — VARICELLA ZOSTER ANTIBODY, IGG: Varicella zoster IgG: 967 index (ref >165)

## 2021-01-01 LAB — MONITOR DRUG PROFILE 14(MW)
Amphetamine Scrn, Ur: NEGATIVE ng/mL
BARBITURATE SCREEN URINE: NEGATIVE ng/mL
BENZODIAZEPINE SCREEN, URINE: NEGATIVE ng/mL
Buprenorphine, Urine: NEGATIVE ng/mL
CANNABINOIDS UR QL SCN: NEGATIVE ng/mL
Cocaine (Metab) Scrn, Ur: NEGATIVE ng/mL
Creatinine(Crt), U: 229.6 mg/dL (ref 20.0–300.0)
Fentanyl, Urine: NEGATIVE pg/mL
Meperidine Screen, Urine: NEGATIVE ng/mL
Methadone Screen, Urine: NEGATIVE ng/mL
OXYCODONE+OXYMORPHONE UR QL SCN: NEGATIVE ng/mL
Opiate Scrn, Ur: NEGATIVE ng/mL
Ph of Urine: 6.3 (ref 4.5–8.9)
Phencyclidine Qn, Ur: NEGATIVE ng/mL
Propoxyphene Scrn, Ur: NEGATIVE ng/mL
SPECIFIC GRAVITY: 1.026
Tramadol Screen, Urine: NEGATIVE ng/mL

## 2021-01-01 LAB — URINE CULTURE: Organism ID, Bacteria: NO GROWTH

## 2021-01-01 LAB — GC/CHLAMYDIA PROBE AMP
Chlamydia trachomatis, NAA: NEGATIVE
Neisseria Gonorrhoeae by PCR: NEGATIVE

## 2021-01-18 ENCOUNTER — Ambulatory Visit (INDEPENDENT_AMBULATORY_CARE_PROVIDER_SITE_OTHER): Payer: BC Managed Care – PPO

## 2021-01-18 ENCOUNTER — Encounter: Payer: Self-pay | Admitting: Certified Nurse Midwife

## 2021-01-18 ENCOUNTER — Other Ambulatory Visit: Payer: Self-pay

## 2021-01-18 ENCOUNTER — Other Ambulatory Visit: Payer: Self-pay | Admitting: Physician Assistant

## 2021-01-18 ENCOUNTER — Ambulatory Visit (INDEPENDENT_AMBULATORY_CARE_PROVIDER_SITE_OTHER): Payer: BC Managed Care – PPO | Admitting: Certified Nurse Midwife

## 2021-01-18 ENCOUNTER — Other Ambulatory Visit: Payer: Self-pay | Admitting: Certified Nurse Midwife

## 2021-01-18 VITALS — BP 114/79 | HR 93 | Wt 255.4 lb

## 2021-01-18 DIAGNOSIS — Z3492 Encounter for supervision of normal pregnancy, unspecified, second trimester: Secondary | ICD-10-CM

## 2021-01-18 DIAGNOSIS — Z3A19 19 weeks gestation of pregnancy: Secondary | ICD-10-CM

## 2021-01-18 NOTE — Progress Notes (Signed)
Body mass index is 37.71 kg/m.   ROB doing well. Anatomy scan today. Results reviewed ( see below). All questions answered. Discussed musculoskeletal discomforts and self help measures. She verbalizes understanding. Follow up 4 wk with Marcelino Duster or prn .   Doreene Burke, CNM  Patient Name: Anne Clark DOB: [birth year] MRN: [id number] ULTRASOUND REPORT  Location: [facility] Date of Service: 01/18/2021   Indications:Anatomy Ultrasound Findings:  Mason Jim intrauterine pregnancy is visualized with FHR at 150 BPM. Biometrics give an (U/S) Gestational age of [redacted]w[redacted]d and an (U/S) EDD of 06/03/2021; this correlates with the clinically established Estimated Date of Delivery: 06/09/21  Fetal presentation is transverse.  EFW: 351 g ( 12 oz) Fetal Percentile  Placenta: anterior. Grade: 1 AFI: subjectively normal.  Anatomic survey is complete and normal; Gender - surprise.    Right Ovary is normal in appearance. Left Ovary is normal appearance. Survey of the adnexa demonstrates no adnexal masses. There is no free peritoneal fluid in the cul de sac.  Impression: 1. [redacted]w[redacted]d Viable Singleton Intrauterine pregnancy by U/S. 2. (U/S) EDD is consistent with Clinically established Estimated Date of Delivery: 06/09/21 . 3. Normal Anatomy Scan  Recommendations: 1.Clinical correlation with the patient's History and Physical Exam.

## 2021-01-18 NOTE — Patient Instructions (Signed)

## 2021-01-31 ENCOUNTER — Telehealth: Payer: Self-pay

## 2021-01-31 NOTE — Telephone Encounter (Signed)
Pt called when office was closed for lunch.   She c/o of a fishy smell, thick discharge, and itch x 4 days. No new partners or detergents. Has had BV in last pregnancy.   She is requesting medication. Pt aware you will get this message on 2/22. [facility] [address]    Pls advise.

## 2021-02-01 ENCOUNTER — Other Ambulatory Visit: Payer: Self-pay | Admitting: Certified Nurse Midwife

## 2021-02-01 MED ORDER — METRONIDAZOLE 500 MG PO TABS: 500.0000 mg | ORAL_TABLET | Freq: Two times a day (BID) | ORAL | 0 refills | Status: AC

## 2021-02-01 NOTE — Telephone Encounter (Signed)
Pt aware per VM.  

## 2021-02-01 NOTE — Telephone Encounter (Signed)
I sent in the order for flagyl , please let her know.   Thanks  Anne Clark

## 2021-02-05 ENCOUNTER — Emergency Department (HOSPITAL_COMMUNITY)
Admission: EM | Admit: 2021-02-05 | Discharge: 2021-02-05 | Disposition: A | Discharge status: Home / Self Care | Payer: BC Managed Care – PPO | Attending: Emergency Medicine | Admitting: Emergency Medicine

## 2021-02-05 ENCOUNTER — Emergency Department (HOSPITAL_COMMUNITY): Payer: BC Managed Care – PPO

## 2021-02-05 ENCOUNTER — Encounter (HOSPITAL_COMMUNITY): Payer: Self-pay | Admitting: Emergency Medicine

## 2021-02-05 ENCOUNTER — Other Ambulatory Visit: Payer: Self-pay

## 2021-02-05 DIAGNOSIS — R000 Tachycardia, unspecified: Secondary | ICD-10-CM | POA: Insufficient documentation

## 2021-02-05 DIAGNOSIS — R0789 Other chest pain: Secondary | ICD-10-CM | POA: Diagnosis not present

## 2021-02-05 DIAGNOSIS — R002 Palpitations: Secondary | ICD-10-CM

## 2021-02-05 LAB — TROPONIN I (HIGH SENSITIVITY)
Troponin I (High Sensitivity): 5 ng/L (ref <18)
Troponin I (High Sensitivity): 4 ng/L (ref <18)

## 2021-02-05 LAB — CBC
HCT: 34.5 % — ABNORMAL LOW (ref 36.0–46.0)
Hemoglobin: 11.8 g/dL — ABNORMAL LOW (ref 12.0–15.0)
MCH: 28.6 pg (ref 26.0–34.0)
MCHC: 34.2 g/dL (ref 30.0–36.0)
MCV: 83.5 fL (ref 80.0–100.0)
Platelets: 358 10*3/uL (ref 150–400)
RBC: 4.13 MIL/uL (ref 3.87–5.11)
RDW: 12.9 % (ref 11.5–15.5)
WBC: 13.5 10*3/uL — ABNORMAL HIGH (ref 4.0–10.5)
nRBC: 0 % (ref 0.0–0.2)

## 2021-02-05 LAB — BASIC METABOLIC PANEL
Anion gap: 9 (ref 5–15)
BUN: <5 mg/dL — ABNORMAL LOW (ref 6–20)
CO2: 20 mmol/L — ABNORMAL LOW (ref 22–32)
Calcium: 9.2 mg/dL (ref 8.9–10.3)
Chloride: 104 mmol/L (ref 98–111)
Creatinine, Ser: 0.53 mg/dL (ref 0.44–1.00)
GFR, Estimated: >60 mL/min (ref >60)
Glucose, Bld: 99 mg/dL (ref 70–99)
Potassium: 3.4 mmol/L — ABNORMAL LOW (ref 3.5–5.1)
Sodium: 133 mmol/L — ABNORMAL LOW (ref 135–145)

## 2021-02-05 LAB — I-STAT BETA HCG BLOOD, ED (MC, WL, AP ONLY): I-stat hCG, quantitative: >2000 m[IU]/mL — ABNORMAL HIGH (ref <5)

## 2021-02-05 LAB — D-DIMER, QUANTITATIVE: D-Dimer, Quant: 0.34 ug/mL-FEU (ref 0.00–0.50)

## 2021-02-05 MED ORDER — FAMOTIDINE 20 MG PO TABS: 20.0000 mg | ORAL_TABLET | Freq: Two times a day (BID) | ORAL | 3 refills | Status: DC

## 2021-02-05 MED ORDER — SODIUM CHLORIDE 0.9 % IV BOLUS
1000.0000 mL | Freq: Once | INTRAVENOUS | Status: AC
  Administered 2021-02-05: 1000 mL via INTRAVENOUS

## 2021-02-05 NOTE — ED Notes (Signed)
fht lower mid-abd [phone]

## 2021-02-05 NOTE — ED Notes (Signed)
Rapid heart rate

## 2021-02-05 NOTE — ED Provider Notes (Signed)
[facility] EMERGENCY DEPARTMENT Provider Note   CSN: [id number] Arrival date & time: 02/05/21  0038     History Chief Complaint  Patient presents with   Chest Pain    Anne Clark is a 35 y.o. female.  Patient presents to the emergency department for evaluation of chest discomfort and heart palpitations.  Patient reports that symptoms have been intermittent for 3 weeks.  She feels a pounding in her chest and a heaviness in the chest intermittently, more frequently when she is lying down in bed at night than during the day.  No associated nausea, vomiting or diarrhea.  When she is feeling the pounding in her chest she feels short of breath.  No cough or chest congestion.  No fever or upper respiratory infection symptoms.  Patient is [redacted] weeks pregnant.  No abdominal or pelvic pain, vaginal bleeding or discharge.        History reviewed. No pertinent past medical history.  Patient Active Problem List   Diagnosis Date Noted   Low grade squamous intraepithelial lesion (LGSIL) on cervical Pap smear 08/19/2014    Past Surgical History:  Procedure Laterality Date   DILATION AND CURETTAGE OF UTERUS     DILATION AND EVACUATION  05/20/2012   missed ab     OB History    Gravida  5   Para  2   Term      Preterm      AB  2   Living  2     SAB  2   IAB      Ectopic      Multiple      Live Births  2           Family History  Problem Relation Age of Onset   Diabetes Father    Hypertension Father    Breast cancer Paternal Aunt     Social History   Tobacco Use   Smoking status: Never Smoker   Smokeless tobacco: Never Used  Substance Use Topics   Alcohol use: Not Currently    Alcohol/week: 1.0 standard drink    Types: 1 Standard drinks or equivalent per week   Drug use: No    Home Medications Prior to Admission medications   Medication Sig Start Date End Date Taking? Authorizing Provider  famotidine (PEPCID) 20 MG  tablet Take 1 tablet (20 mg total) by mouth 2 (two) times daily. 02/05/21  Yes Taela Charbonneau, Canary Brim, MD  ergocalciferol (VITAMIN D2) 50000 UNITS capsule Take 50,000 Units by mouth once a week.    [provider]  metroNIDAZOLE (FLAGYL) 500 MG tablet Take 1 tablet (500 mg total) by mouth 2 (two) times daily for 7 days. 02/01/21 02/08/21  Doreene Burke, CNM  Prenatal Vit-Fe Fumarate-FA (PRENATAL MULTIVITAMIN) TABS tablet Take 1 tablet by mouth daily at 12 noon.    [provider]  terconazole (TERAZOL 7) 0.4 % vaginal cream Place 1 applicator vaginally at bedtime. Patient not taking: Reported on 01/18/2021 11/15/20   [provider]    Allergies    Patient has no known allergies.  Review of Systems   Review of Systems  Respiratory: Positive for shortness of breath.   Cardiovascular: Positive for chest pain and palpitations.  All other systems reviewed and are negative.   Physical Exam Updated Vital Signs BP (!) 99/44    Pulse 87    Temp (!) 97.5 F (36.4 C) (Oral)    Resp (!) 22  LMP 08/29/2020 (Exact Date)    SpO2 97%   Physical Exam Vitals and nursing note reviewed.  Constitutional:      General: She is not in acute distress.    Appearance: Normal appearance. She is well-developed and well-nourished.  HENT:     Head: Normocephalic and atraumatic.     Right Ear: Hearing normal.     Left Ear: Hearing normal.     Nose: Nose normal.     Mouth/Throat:     Mouth: Oropharynx is clear and moist and mucous membranes are normal.  Eyes:     Extraocular Movements: EOM normal.     Conjunctiva/sclera: Conjunctivae normal.     Pupils: Pupils are equal, round, and reactive to light.  Cardiovascular:     Rate and Rhythm: Regular rhythm. Tachycardia present.     Heart sounds: S1 normal and S2 normal. No murmur heard. No friction rub. No gallop.   Pulmonary:     Effort: Pulmonary effort is normal. No respiratory distress.     Breath sounds: Normal breath sounds.   Chest:     Chest wall: No tenderness.  Abdominal:     General: Bowel sounds are normal.     Palpations: Abdomen is soft. There is no hepatosplenomegaly.     Tenderness: There is no abdominal tenderness. There is no guarding or rebound. Negative signs include Murphy's sign and McBurney's sign.     Hernia: No hernia is present.  Musculoskeletal:        General: Normal range of motion.     Cervical back: Normal range of motion and neck supple.  Skin:    General: Skin is warm, dry and intact.     Findings: No rash.     Nails: There is no cyanosis.  Neurological:     Mental Status: She is alert and oriented to person, place, and time.     GCS: GCS eye subscore is 4. GCS verbal subscore is 5. GCS motor subscore is 6.     Cranial Nerves: No cranial nerve deficit.     Sensory: No sensory deficit.     Coordination: Coordination normal.     Deep Tendon Reflexes: Strength normal.  Psychiatric:        Mood and Affect: Mood and affect normal.        Speech: Speech normal.        Behavior: Behavior normal.        Thought Content: Thought content normal.     ED Results / Procedures / Treatments   Labs (all labs ordered are listed, but only abnormal results are displayed) Labs Reviewed  BASIC METABOLIC PANEL - Abnormal; Notable for the following components:      Result Value   Sodium 133 (*)    Potassium 3.4 (*)    CO2 20 (*)    BUN <5 (*)    All other components within normal limits  CBC - Abnormal; Notable for the following components:   WBC 13.5 (*)    Hemoglobin 11.8 (*)    HCT 34.5 (*)    All other components within normal limits  I-STAT BETA HCG BLOOD, ED ([facility], [facility], [facility] ONLY) - Abnormal; Notable for the following components:   I-stat hCG, quantitative >2,000.0 (*)    All other components within normal limits  D-DIMER, QUANTITATIVE  TROPONIN I (HIGH SENSITIVITY)  TROPONIN I (HIGH SENSITIVITY)    EKG EKG Interpretation  Date/Time:  Saturday February 05 2021 00:47:59  EST Ventricular Rate:  104 PR Interval:  130 QRS Duration: 76 QT Interval:  338 QTC Calculation: 444 R Axis:   20 Text Interpretation: Sinus tachycardia Otherwise normal ECG Confirmed by Gilda Crease [phone]) on 02/05/2021 3:00:02 AM   Radiology DG Chest 2 View  Result Date: 02/05/2021 CLINICAL DATA:  Chest pain EXAM: CHEST - 2 VIEW COMPARISON:  None. FINDINGS: The heart size and mediastinal contours are within normal limits. Both lungs are clear. The visualized skeletal structures are unremarkable. IMPRESSION: No active cardiopulmonary disease. Electronically Signed   By: Jonna Clark M.D.   On: 02/05/2021 01:29    Procedures Procedures   Medications Ordered in ED Medications  sodium chloride 0.9 % bolus 1,000 mL (1,000 mLs Intravenous New Bag/Given 02/05/21 2202)    ED Course  I have reviewed the triage vital signs and the nursing notes.  Pertinent labs & imaging results that were available during my care of the patient were reviewed by me and considered in my medical decision making (see chart for details).    MDM Rules/Calculators/A&P                          Patient presents to the emergency department with complaints of rapid heart rate and heaviness in her chest.  Patient reports that the symptoms wax and wane and seem to be more present at night.  She has normal blood pressures.  Heart rate was mildly elevated at arrival.  Because she is pregnant, PE is considered a possibility, although current presentation is felt to be atypical and overall clinical likelihood of PE is felt to be quite low. Wells Criteria = 1.5, low risk group.  D-dimer is normal and therefore no further work-up necessary.  Patient does appear somewhat anxious, likely the cause of her tachycardia.  As she is experiencing symptoms at night and is [redacted] weeks pregnant, reflux is considered a possibility as well. Final Clinical Impression(s) / ED Diagnoses Final diagnoses:  Palpitations    Rx / DC  Orders ED Discharge Orders         Ordered    famotidine (PEPCID) 20 MG tablet  2 times daily        02/05/21 0546           Gilda Crease, MD 02/05/21 [phone]

## 2021-02-05 NOTE — ED Triage Notes (Signed)
Patient here with chest pains on and off for the last 3 weeks.  Patient is [redacted] weeks pregnant, states that she did not have the chest pain with her first two pregnancies.  She states that she does have some shortness of breath and some nausea.  No vomiting.  The pains feel like palpitations and throbbing sensation in her chest.

## 2021-02-14 ENCOUNTER — Ambulatory Visit (INDEPENDENT_AMBULATORY_CARE_PROVIDER_SITE_OTHER): Payer: BC Managed Care – PPO | Admitting: Certified Nurse Midwife

## 2021-02-14 ENCOUNTER — Encounter: Payer: Self-pay | Admitting: Certified Nurse Midwife

## 2021-02-14 ENCOUNTER — Other Ambulatory Visit: Payer: Self-pay

## 2021-02-14 ENCOUNTER — Other Ambulatory Visit (HOSPITAL_COMMUNITY)
Admission: RE | Admit: 2021-02-14 | Discharge: 2021-02-14 | Disposition: A | Discharge status: Home / Self Care | Payer: BC Managed Care – PPO | Source: Ambulatory Visit | Attending: Certified Nurse Midwife | Admitting: Certified Nurse Midwife

## 2021-02-14 VITALS — BP 114/71 | HR 89 | Wt 259.8 lb

## 2021-02-14 DIAGNOSIS — Z8742 Personal history of other diseases of the female genital tract: Secondary | ICD-10-CM

## 2021-02-14 DIAGNOSIS — O26892 Other specified pregnancy related conditions, second trimester: Secondary | ICD-10-CM | POA: Diagnosis present

## 2021-02-14 DIAGNOSIS — N898 Other specified noninflammatory disorders of vagina: Secondary | ICD-10-CM | POA: Diagnosis present

## 2021-02-14 DIAGNOSIS — B9689 Other specified bacterial agents as the cause of diseases classified elsewhere: Secondary | ICD-10-CM | POA: Insufficient documentation

## 2021-02-14 DIAGNOSIS — Z3482 Encounter for supervision of other normal pregnancy, second trimester: Secondary | ICD-10-CM | POA: Diagnosis present

## 2021-02-14 DIAGNOSIS — O23592 Infection of other part of genital tract in pregnancy, second trimester: Secondary | ICD-10-CM | POA: Diagnosis not present

## 2021-02-14 DIAGNOSIS — Z3A23 23 weeks gestation of pregnancy: Secondary | ICD-10-CM | POA: Diagnosis not present

## 2021-02-14 DIAGNOSIS — B373 Candidiasis of vulva and vagina: Secondary | ICD-10-CM | POA: Diagnosis not present

## 2021-02-14 LAB — POCT URINALYSIS DIPSTICK OB
Bilirubin, UA: NEGATIVE
Blood, UA: NEGATIVE
Glucose, UA: NEGATIVE
Nitrite, UA: NEGATIVE
Spec Grav, UA: 1.015 (ref 1.010–1.025)
Urobilinogen, UA: 0.2 E.U./dL
pH, UA: 7 (ref 5.0–8.0)

## 2021-02-14 NOTE — Progress Notes (Signed)
OB-pt present for routine prenatal care. Pt stated that she was doing well.  

## 2021-02-14 NOTE — Patient Instructions (Addendum)
Vaginitis  Vaginitis is a condition in which the vaginal tissue swells and becomes irritated. This condition is most often caused by a change in the normal balance of bacteria and yeast that live in the vagina. This change causes an overgrowth of certain bacteria or yeast, which causes the inflammation. There are different types of vaginitis. What are the causes? The cause of this condition depends on the type of vaginitis. It can be caused by:  Bacteria (bacterial vaginosis).  Yeast, which is a fungus (candidiasis).  A parasite (trichomoniasis vaginitis).  A virus (viral vaginitis).  Low hormone levels (atrophic vaginitis). Low hormone levels can occur during pregnancy, breastfeeding, or after menopause.  Irritants, such as bubble baths, scented tampons, and feminine sprays (allergic vaginitis). Other factors can change the normal balance of the yeast and bacteria that live in the vagina. These include:  Antibiotic medicines.  Poor hygiene.  Diaphragms, vaginal sponges, spermicides, birth control pills, and intrauterine devices (IUDs).  Sex.  Infection.  Uncontrolled diabetes.  A weakened body defense system (immune system). What increases the risk? This condition is more likely to develop in women who:  Smoke or are exposed to secondhand smoke.  Use vaginal douches, scented tampons, or scented sanitary pads.  Wear tight-fitting pants or thong underwear.  Use oral birth control pills or an IUD.  Have sex without a condom or have multiple partners.  Have an STI.  Frequently use the spermicide nonoxynol-9.  Eat lots of foods high in sugar or who have uncontrolled diabetes.  Have low estrogen levels.  Have a weakened immune system from an immune disorder or medical treatment.  Are pregnant or breastfeeding. What are the signs or symptoms? Symptoms vary depending on the cause of the vaginitis. Common symptoms include:  Abnormal vaginal discharge. ? The  discharge is white, gray, or yellow with bacterial vaginosis. ? The discharge is thick, white, and cheesy with a yeast infection. ? The discharge is frothy and yellow or greenish with trichomoniasis.  A bad vaginal smell. The smell is fishy with bacterial vaginosis.  Vaginal itching, pain, or swelling.  Pain with sex.  Pain or burning when urinating. Sometimes there are no symptoms. How is this diagnosed? This condition is diagnosed based on your symptoms and medical history. A physical exam, including a pelvic exam, will also be done. You may also have other tests, including:  Tests to determine the pH level (acidity or alkalinity) of your vagina.  A whiff test to assess the odor that results when a sample of your vaginal discharge is mixed with a potassium hydroxide solution.  Tests of vaginal fluid. A sample will be examined under a microscope. How is this treated? Treatment varies depending on the type of vaginitis you have. Your treatment may include:  Antibiotic creams or pills to treat bacterial vaginosis and trichomoniasis.  Antifungal medicines, such as vaginal creams or suppositories, to treat a yeast infection.  Medicine to ease discomfort if you have viral vaginitis. Your sexual partner should also be treated.  Estrogen delivered in a cream, pill, suppository, or vaginal ring to treat atrophic vaginitis. If vaginal dryness occurs, lubricants and moisturizing creams may help. You may need to avoid scented soaps, sprays, or douches.  Stopping use of a product that is causing allergic vaginitis and then using a vaginal cream to treat the symptoms. Follow these instructions at home: Lifestyle  Keep your genital area clean and dry. Avoid soap, and only rinse the area with water.  Do not douche  or use tampons until your health care provider says it is okay. Use sanitary pads, if needed.  Do not have sex until your health care provider approves. When you can return to sex,  practice safe sex and use condoms.  Wipe from front to back. This avoids the spread of bacteria from the rectum to the vagina. General instructions  Take over-the-counter and prescription medicines only as told by your health care provider.  If you were prescribed an antibiotic medicine, take or use it as told by your health care provider. Do not stop taking or using the antibiotic even if you start to feel better.  Keep all follow-up visits. This is important. How is this prevented?  Use mild, unscented products. Do not use things that can irritate the vagina, such as fabric softeners. Avoid the following products if they are scented: ? Feminine sprays. ? Detergents. ? Tampons. ? Feminine hygiene products. ? Soaps or bubble baths.  Let air reach your genital area. To do this: ? Wear cotton underwear to reduce moisture buildup. ? Avoid wearing underwear while you sleep. ? Avoid wearing tight pants and underwear or nylons without a cotton panel. ? Avoid wearing thong underwear.  Take off any wet clothing, such as bathing suits, as soon as possible.  Practice safe sex and use condoms. Contact a health care provider if:  You have abdominal or pelvic pain.  You have a fever or chills.  You have symptoms that last for more than 2-3 days. Get help right away if:  You have a fever and your symptoms suddenly get worse. Summary  Vaginitis is a condition in which the vaginal tissue becomes inflamed.This condition is most often caused by a change in the normal balance of bacteria and yeast that live in the vagina.  Treatment varies depending on the type of vaginitis you have.  Do not douche, use tampons, or have sex until your health care provider approves. When you can return to sex, practice safe sex and use condoms. This information is not intended to replace advice given to you by your health care provider. Make sure you discuss any questions you have with your health care  provider. Document Revised: 05/27/2020 Document Reviewed: 05/27/2020 Elsevier Patient Education  [place].    Oral Glucose Tolerance Test During Pregnancy Why am I having this test? The oral glucose tolerance test (OGTT) is done to check how your body processes blood sugar (glucose). This is one of several tests used to diagnose diabetes that develops during pregnancy (gestational diabetes mellitus). Gestational diabetes is a short-term form of diabetes that some women develop while they are pregnant. It usually occurs during the second trimester of pregnancy and goes away after delivery. Testing, or screening, for gestational diabetes usually occurs at weeks 24-28 of pregnancy. You may have the OGTT test after having a 1-hour glucose screening test if the results from that test indicate that you may have gestational diabetes. This test may also be needed if:  You have a history of gestational diabetes.  There is a history of giving birth to very large babies or of losing pregnancies (having stillbirths).  You have signs and symptoms of diabetes, such as: ? Changes in your eyesight. ? Tingling or numbness in your hands or feet. ? Changes in hunger, thirst, and urination, and these are not explained by your pregnancy. What is being tested? This test measures the amount of glucose in your blood at different times during a period of  3 hours. This shows how well your body can process glucose. What kind of sample is taken? Blood samples are required for this test. They are usually collected by inserting a needle into a blood vessel.   How do I prepare for this test?  For 3 days before your test, eat normally. Have plenty of carbohydrate-rich foods.  Follow instructions from your health care provider about: ? Eating or drinking restrictions on the day of the test. You may be asked not to eat or drink anything other than water (to fast) starting 8-10 hours before the test. ? Changing  or stopping your regular medicines. Some medicines may interfere with this test. Tell a health care provider about:  All medicines you are taking, including vitamins, herbs, eye drops, creams, and over-the-counter medicines.  Any blood disorders you have.  Any surgeries you have had.  Any medical conditions you have. What happens during the test? First, your blood glucose will be measured. This is referred to as your fasting blood glucose because you fasted before the test. Then, you will drink a glucose solution that contains a certain amount of glucose. Your blood glucose will be measured again 1, 2, and 3 hours after you drink the solution. This test takes about 3 hours to complete. You will need to stay at the testing location during this time. During the testing period:  Do not eat or drink anything other than the glucose solution.  Do not exercise.  Do not use any products that contain nicotine or tobacco, such as cigarettes, e-cigarettes, and chewing tobacco. These can affect your test results. If you need help quitting, ask your health care provider. The testing procedure may vary among health care providers and hospitals. How are the results reported? Your results will be reported as milligrams of glucose per deciliter of blood (mg/dL) or millimoles per liter (mmol/L). There is more than one source for screening and diagnosis reference values used to diagnose gestational diabetes. Your health care provider will compare your results to normal values that were established after testing a large group of people (reference values). Reference values may vary among labs and hospitals. For this test ([place]arp[place]-Coustan), reference values are:  Fasting: 95 mg/dL (5.3 mmol/L).  1 hour: 180 mg/dL (10.0 mmol/L).  2 hour: 155 mg/dL (8.6 mmol/L).  3 hour: 140 mg/dL (7.8 mmol/L). What do the results mean? Results below the reference values are considered normal. If two or more of your blood  glucose levels are at or above the reference values, you may be diagnosed with gestational diabetes. If only one level is high, your health care provider may suggest repeat testing or other tests to confirm a diagnosis. Talk with your health care provider about what your results mean. Questions to ask your health care provider Ask your health care provider, or the department that is doing the test:  When will my results be ready?  How will I get my results?  What are my treatment options?  What other tests do I need?  What are my next steps? Summary  The oral glucose tolerance test (OGTT) is one of several tests used to diagnose diabetes that develops during pregnancy (gestational diabetes mellitus). Gestational diabetes is a short-term form of diabetes that some women develop while they are pregnant.  You may have the OGTT test after having a 1-hour glucose screening test if the results from that test show that you may have gestational diabetes. You may also have this test if you  have any symptoms or risk factors for this type of diabetes.  Talk with your health care provider about what your results mean. This information is not intended to replace advice given to you by your health care provider. Make sure you discuss any questions you have with your health care provider. Document Revised: 05/06/2020 Document Reviewed: 05/06/2020 Elsevier Patient Education  [place].   WHAT OB PATIENTS CAN EXPECT   Confirmation of pregnancy and ultrasound ordered if medically indicated-[redacted] weeks gestation  New OB (NOB) intake with nurse and New OB (NOB) labs- [redacted] weeks gestation  New OB (NOB) physical examination with provider- 11/[redacted] weeks gestation  Flu vaccine-[redacted] weeks gestation  Anatomy scan-[redacted] weeks gestation  Glucose tolerance test, blood work to test for anemia, T-dap vaccine-[redacted] weeks gestation  Vaginal swabs/cultures-STD/Group B strep-[redacted] weeks gestation  Appointments every 4  weeks until 28 weeks  Every 2 weeks from 28 weeks until 36 weeks  Weekly visits from 26 weeks until delivery  Second Trimester of Pregnancy  The second trimester of pregnancy is from week 13 through week 27. This is also called months 4 through 6 of pregnancy. This is often the time when you feel your best. During the second trimester:  Morning sickness is less or has stopped.  You may have more energy.  You may feel hungry more often. At this time, your unborn baby (fetus) is growing very fast. At the end of the sixth month, the unborn baby may be up to 12 inches long and weigh about 1 pounds. You will likely start to feel the baby move between 16 and 20 weeks of pregnancy. Body changes during your second trimester Your body continues to go through many changes during this time. The changes vary and generally return to normal after the baby is born. Physical changes  You will gain more weight.  You may start to get stretch marks on your hips, belly (abdomen), and breasts.  Your breasts will grow and may hurt.  Dark spots or blotches may develop on your face.  A dark line from your belly button to the pubic area (linea nigra) may appear.  You may have changes in your hair. Health changes  You may have headaches.  You may have heartburn.  You may have trouble pooping (constipation).  You may have hemorrhoids or swollen, bulging veins (varicose veins).  Your gums may bleed.  You may pee (urinate) more often.  You may have back pain. Follow these instructions at home: Medicines  Take over-the-counter and prescription medicines only as told by your doctor. Some medicines are not safe during pregnancy.  Take a prenatal vitamin that contains at least 600 micrograms (mcg) of folic acid. Eating and drinking  Eat healthy meals that include: ? Fresh fruits and vegetables. ? Whole grains. ? Good sources of protein, such as meat, eggs, or tofu. ? Low-fat dairy  products.  Avoid raw meat and unpasteurized juice, milk, and cheese.  You may need to take these actions to prevent or treat trouble pooping: ? Drink enough fluids to keep your pee (urine) pale yellow. ? Eat foods that are high in fiber. These include beans, whole grains, and fresh fruits and vegetables. ? Limit foods that are high in fat and sugar. These include fried or sweet foods. Activity  Exercise only as told by your doctor. Most people can do their usual exercise during pregnancy. Try to exercise for 30 minutes at least 5 days a week.  Stop exercising if  you have pain or cramps in your belly or lower back.  Do not exercise if it is too hot or too humid, or if you are in a place of great height (high altitude).  Avoid heavy lifting.  If you choose to, you may have sex unless your doctor tells you not to. Relieving pain and discomfort  Wear a good support bra if your breasts are sore.  Take warm water baths (sitz baths) to soothe pain or discomfort caused by hemorrhoids. Use hemorrhoid cream if your doctor approves.  Rest with your legs raised (elevated) if you have leg cramps or low back pain.  If you develop bulging veins in your legs: ? Wear support hose as told by your doctor. ? Raise your feet for 15 minutes, 3-4 times a day. ? Limit salt in your food. Safety  Wear your seat belt at all times when you are in a car.  Talk with your doctor if someone is hurting you or yelling at you a lot. Lifestyle  Do not use hot tubs, steam rooms, or saunas.  Do not douche. Do not use tampons or scented sanitary pads.  Avoid cat litter boxes and soil used by cats. These carry germs that can harm your baby and can cause a loss of your baby by miscarriage or stillbirth.  Do not use herbal medicines, illegal drugs, or medicines that are not approved by your doctor. Do not drink alcohol.  Do not smoke or use any products that contain nicotine or tobacco. If you need help  quitting, ask your doctor. General instructions  Keep all follow-up visits. This is important.  Ask your doctor about local prenatal classes.  Ask your doctor about the right foods to eat or for help finding a counselor. Where to find more information  American Pregnancy Association: americanpregnancy.org  [employer]: www.acog.org  Office on [employer]: KeywordPortfolios.com.br Contact a doctor if:  You have a headache that does not go away when you take medicine.  You have changes in how you see, or you see spots in front of your eyes.  You have mild cramps, pressure, or pain in your lower belly.  You continue to feel like you may vomit (nauseous), you vomit, or you have watery poop (diarrhea).  You have bad-smelling fluid coming from your vagina.  You have pain when you pee or your pee smells bad.  You have very bad swelling of your face, hands, ankles, feet, or legs.  You have a fever. Get help right away if:  You are leaking fluid from your vagina.  You have spotting or bleeding from your vagina.  You have very bad belly cramping or pain.  You have trouble breathing.  You have chest pain.  You faint.  You have not felt your baby move for the time period told by your doctor.  You have new or increased pain, swelling, or redness in an arm or leg. Summary  The second trimester of pregnancy is from week 13 through week 27 (months 4 through 6).  Eat healthy meals.  Exercise as told by your doctor. Most people can do their usual exercise during pregnancy.  Do not use herbal medicines, illegal drugs, or medicines that are not approved by your doctor. Do not drink alcohol.  Call your doctor if you get sick or if you notice anything unusual about your pregnancy. This information is not intended to replace advice given to you by your health care provider.  Make sure you discuss any questions you have with your health  care provider. Document Revised: 05/05/2020 Document Reviewed: 03/11/2020 Elsevier Patient Education  [place]. Common Medications Safe in Pregnancy  Acne:      Constipation:  Benzoyl Peroxide     Colace  Clindamycin      Dulcolax Suppository  Topica Erythromycin     Fibercon  Salicylic Acid      Metamucil         Miralax AVOID:        Senakot   Accutane    Cough:  Retin-A       Cough Drops  Tetracycline      Phenergan w/ Codeine if Rx  Minocycline      Robitussin (Plain & DM)  Antibiotics:     Crabs/Lice:  Ceclor       RID  Cephalosporins    AVOID:  E-Mycins      Kwell  Keflex  Macrobid/Macrodantin   Diarrhea:  Penicillin      Kao-Pectate  Zithromax      Imodium AD         PUSH FLUIDS AVOID:       Cipro     Fever:  Tetracycline      Tylenol (Regular or Extra  Minocycline       Strength)  Levaquin      Extra Strength-Do not          Exceed 8 tabs/24 hrs Caffeine:        '200mg'$ /day (equiv. To 1 cup of coffee or  approx. 3 12 oz sodas)         Gas: Cold/Hayfever:       Gas-X  Benadryl      Mylicon  Claritin       Phazyme  **Claritin-D        Chlor-Trimeton    Headaches:  Dimetapp      ASA-Free Excedrin  Drixoral-Non-Drowsy     Cold Compress  Mucinex (Guaifenasin)     Tylenol (Regular or Extra  Sudafed/Sudafed-12 Hour     Strength)  **Sudafed PE Pseudoephedrine   Tylenol Cold & Sinus     Vicks Vapor Rub  Zyrtec  **AVOID if Problems With Blood Pressure         Heartburn: Avoid lying down for at least 1 hour after meals  Aciphex      Maalox     Rash:  Milk of Magnesia     Benadryl    Mylanta       1% Hydrocortisone Cream  Pepcid  Pepcid Complete   Sleep Aids:  Prevacid      Ambien   Prilosec       Benadryl  Rolaids       Chamomile Tea  Tums (Limit 4/day)     Unisom         Tylenol PM         Warm milk-add vanilla or  Hemorrhoids:       Sugar for taste  Anusol/Anusol H.C.  (RX: Analapram 2.5%)  Sugar Substitutes:  Hydrocortisone OTC     Ok  in moderation  Preparation H      Tucks        Vaseline lotion applied to tissue with wiping    Herpes:     Throat:  Acyclovir      Oragel  Famvir  Valtrex     Vaccines:         Flu Shot Leg Cramps:       *  Pneumovax  Saline Nasal Spray     [place] Booster         Tetanus Nausea:       Tuberculosis test or PPD  Vitamin B6 25 mg TID   AVOID:    Dramamine      *Gardasil  Emetrol       Live Poliovirus  Ginger Root 250 mg QID    MMR (measles, mumps &  High Complex Carbs @ Bedtime    rebella)  Sea Bands-Accupressure    Varicella (Chickenpox)  Unisom 1/2 tab TID     *No known complications           If received before Pain:         Known pregnancy;   Darvocet       Resume series after  Lortab        Delivery  Percocet    Yeast:   Tramadol      Femstat  Tylenol 3      Gyne-lotrimin  Ultram       Monistat  Vicodin           MISC:         All Sunscreens           Hair Coloring/highlights          Insect Repellant's          (Including DEET)         Mystic Tans  

## 2021-02-14 NOTE — Progress Notes (Signed)
ROB- Doing well overall. Reports feeling as though BV has not gone away since treatment, has some burning not accociated with urination and a mild smell after a few hours, denies itching. Desires to be rechecked for BV and yeast today, see orders. Anticipatory guidance regarding course of prenatal care. Reviewed red flag symptoms and when to call. RTC 4-5 weeks for 28 week labs and ROB with ANNIE or sooner if needed.  Juliann Pares, Student-MidWife [employer] 02/14/21 3:38 PM

## 2021-02-14 NOTE — Progress Notes (Signed)
I have seen, interviewed, and examined the patient in conjunction with the [employer] and affirm the diagnosis and management plan.   Gunnar Bulla, CNM [facility] 02/14/21 3:45 PM

## 2021-02-15 ENCOUNTER — Encounter: Payer: BC Managed Care – PPO | Admitting: Certified Nurse Midwife

## 2021-02-17 LAB — CERVICOVAGINAL ANCILLARY ONLY
Bacterial Vaginitis (gardnerella): POSITIVE — AB
Candida Glabrata: NEGATIVE
Candida Vaginitis: POSITIVE — AB
Comment: NEGATIVE
Comment: NEGATIVE
Comment: NEGATIVE

## 2021-02-17 NOTE — Telephone Encounter (Signed)
May have Terazol and Metrogel. Thanks, JML

## 2021-02-18 ENCOUNTER — Other Ambulatory Visit: Payer: Self-pay

## 2021-02-18 MED ORDER — METRONIDAZOLE 0.75 % VA GEL: 1.0000 | Freq: Every day | VAGINAL | 0 refills | Status: DC

## 2021-03-16 ENCOUNTER — Encounter: Payer: BC Managed Care – PPO | Admitting: Certified Nurse Midwife

## 2021-03-16 ENCOUNTER — Other Ambulatory Visit: Payer: BC Managed Care – PPO

## 2021-03-17 ENCOUNTER — Other Ambulatory Visit: Payer: Self-pay | Admitting: Surgical

## 2021-03-17 DIAGNOSIS — Z3A28 28 weeks gestation of pregnancy: Secondary | ICD-10-CM

## 2021-03-18 ENCOUNTER — Encounter: Payer: BC Managed Care – PPO | Admitting: Certified Nurse Midwife

## 2021-03-18 ENCOUNTER — Other Ambulatory Visit: Payer: BC Managed Care – PPO

## 2021-03-23 ENCOUNTER — Encounter: Payer: Self-pay | Admitting: Certified Nurse Midwife

## 2021-03-23 ENCOUNTER — Ambulatory Visit (INDEPENDENT_AMBULATORY_CARE_PROVIDER_SITE_OTHER): Payer: BC Managed Care – PPO | Admitting: Certified Nurse Midwife

## 2021-03-23 ENCOUNTER — Other Ambulatory Visit: Payer: Self-pay

## 2021-03-23 ENCOUNTER — Other Ambulatory Visit: Payer: BC Managed Care – PPO

## 2021-03-23 VITALS — BP 116/73 | HR 96 | Wt 265.6 lb

## 2021-03-23 DIAGNOSIS — Z3A28 28 weeks gestation of pregnancy: Secondary | ICD-10-CM

## 2021-03-23 DIAGNOSIS — Z3403 Encounter for supervision of normal first pregnancy, third trimester: Secondary | ICD-10-CM

## 2021-03-23 LAB — POCT URINALYSIS DIPSTICK OB
Bilirubin, UA: NEGATIVE
Blood, UA: POSITIVE
Glucose, UA: NEGATIVE
Ketones, UA: POSITIVE
Nitrite, UA: NEGATIVE
Spec Grav, UA: 1.01 (ref 1.010–1.025)
Urobilinogen, UA: 0.2 E.U./dL
pH, UA: 7 (ref 5.0–8.0)

## 2021-03-23 NOTE — Progress Notes (Signed)
ROB: Doing well. Having lots of pressure today. She declined getting her TDAP today. BTC consent was signed.

## 2021-03-23 NOTE — Patient Instructions (Signed)
Td (Tetanus, Diphtheria) Vaccine: What You Need to Know 1. Why get vaccinated? Td vaccine can prevent tetanus and diphtheria. Tetanus enters the body through cuts or wounds. Diphtheria spreads from person to person.  TETANUS (T) causes painful stiffening of the muscles. Tetanus can lead to serious health problems, including being unable to open the mouth, having trouble swallowing and breathing, or death.  DIPHTHERIA (D) can lead to difficulty breathing, heart failure, paralysis, or death. 2. Td vaccine Td is only for children 7 years and older, adolescents, and adults.  Td is usually given as a booster dose every 10 years, or after 5 years in the case of a severe or dirty wound or burn. Another vaccine, called "Tdap," may be used instead of Td. Tdap protects against pertussis, also known as "whooping cough," in addition to tetanus and diphtheria. Td may be given at the same time as other vaccines. 3. Talk with your health care provider Tell your vaccination provider if the person getting the vaccine:  Has had an allergic reaction after a previous dose of any vaccine that protects against tetanus or diphtheria, or has any severe, life-threatening allergies  Has ever had Guillain-Barr Syndrome (also called "GBS")  Has had severe pain or swelling after a previous dose of any vaccine that protects against tetanus or diphtheria In some cases, your health care provider may decide to postpone Td vaccination until a future visit. People with minor illnesses, such as a cold, may be vaccinated. People who are moderately or severely ill should usually wait until they recover before getting Td vaccine.  Your health care provider can give you more information. 4. Risks of a vaccine reaction  Pain, redness, or swelling where the shot was given, mild fever, headache, feeling tired, and nausea, vomiting, diarrhea, or stomachache sometimes happen after Td vaccination. People sometimes faint after medical  procedures, including vaccination. Tell your provider if you feel dizzy or have vision changes or ringing in the ears.  As with any medicine, there is a very remote chance of a vaccine causing a severe allergic reaction, other serious injury, or death. 5. What if there is a serious problem? An allergic reaction could occur after the vaccinated person leaves the clinic. If you see signs of a severe allergic reaction (hives, swelling of the face and throat, difficulty breathing, a fast heartbeat, dizziness, or weakness), call 9-1-1 and get the person to the nearest hospital.  For other signs that concern you, call your health care provider.  Adverse reactions should be reported to the Vaccine Adverse Event Reporting System (VAERS). Your health care provider will usually file this report, or you can do it yourself. Visit the VAERS website at www.vaers.hhs.gov or call 1-800-822-7967. VAERS is only for reporting reactions, and VAERS staff members do not give medical advice. 6. The National Vaccine Injury Compensation Program The National Vaccine Injury Compensation Program (VICP) is a federal program that was created to compensate people who may have been injured by certain vaccines. Claims regarding alleged injury or death due to vaccination have a time limit for filing, which may be as short as two years. Visit the VICP website at www.hrsa.gov/vaccinecompensation or call 1-800-338-2382 to learn about the program and about filing a claim. 7. How can I learn more?  Ask your health care provider.  Call your local or state health department.  Visit the website of the Food and Drug Administration (FDA) for vaccine package inserts and additional information at www.fda.gov/vaccines-blood-biologics/vaccines.  Contact the Centers for   Disease Control and Prevention (CDC): ? Call 1-800-232-4636 (1-800-CDC-INFO) or ? Visit CDC's website at www.cdc.gov/vaccines. Vaccine Information Statement Td (Tetanus,  Diphtheria) Vaccine (07/16/2020) This information is not intended to replace advice given to you by your health care provider. Make sure you discuss any questions you have with your health care provider. Document Revised: 09/02/2020 Document Reviewed: 09/02/2020 Elsevier Patient Education  2021 [employer] Oral Glucose Tolerance Test During Pregnancy Why am I having this test? The oral glucose tolerance test (OGTT) is done to check how your body processes blood sugar (glucose). This is one of several tests used to diagnose diabetes that develops during pregnancy (gestational diabetes mellitus). Gestational diabetes is a short-term form of diabetes that some women develop while they are pregnant. It usually occurs during the second trimester of pregnancy and goes away after delivery. Testing, or screening, for gestational diabetes usually occurs at weeks 24-28 of pregnancy. You may have the OGTT test after having a 1-hour glucose screening test if the results from that test indicate that you may have gestational diabetes. This test may also be needed if:  You have a history of gestational diabetes.  There is a history of giving birth to very large babies or of losing pregnancies (having stillbirths).  You have signs and symptoms of diabetes, such as: ? Changes in your eyesight. ? Tingling or numbness in your hands or feet. ? Changes in hunger, thirst, and urination, and these are not explained by your pregnancy. What is being tested? This test measures the amount of glucose in your blood at different times during a period of 3 hours. This shows how well your body can process glucose. What kind of sample is taken? Blood samples are required for this test. They are usually collected by inserting a needle into a blood vessel.   How do I prepare for this test?  For 3 days before your test, eat normally. Have plenty of carbohydrate-rich foods.  Follow instructions from your health care provider  about: ? Eating or drinking restrictions on the day of the test. You may be asked not to eat or drink anything other than water (to fast) starting 8-10 hours before the test. ? Changing or stopping your regular medicines. Some medicines may interfere with this test. Tell a health care provider about:  All medicines you are taking, including vitamins, herbs, eye drops, creams, and over-the-counter medicines.  Any blood disorders you have.  Any surgeries you have had.  Any medical conditions you have. What happens during the test? First, your blood glucose will be measured. This is referred to as your fasting blood glucose because you fasted before the test. Then, you will drink a glucose solution that contains a certain amount of glucose. Your blood glucose will be measured again 1, 2, and 3 hours after you drink the solution. This test takes about 3 hours to complete. You will need to stay at the testing location during this time. During the testing period:  Do not eat or drink anything other than the glucose solution.  Do not exercise.  Do not use any products that contain nicotine or tobacco, such as cigarettes, e-cigarettes, and chewing tobacco. These can affect your test results. If you need help quitting, ask your health care provider. The testing procedure may vary among health care providers and hospitals. How are the results reported? Your results will be reported as milligrams of glucose per deciliter of blood (mg/dL) or millimoles per liter (mmol/L). There is more than   one source for screening and diagnosis reference values used to diagnose gestational diabetes. Your health care provider will compare your results to normal values that were established after testing a large group of people (reference values). Reference values may vary among labs and hospitals. For this test (Carpenter-Coustan), reference values are:  Fasting: 95 mg/dL (5.3 mmol/L).  1 hour: 180 mg/dL (10.0  mmol/L).  2 hour: 155 mg/dL (8.6 mmol/L).  3 hour: 140 mg/dL (7.8 mmol/L). What do the results mean? Results below the reference values are considered normal. If two or more of your blood glucose levels are at or above the reference values, you may be diagnosed with gestational diabetes. If only one level is high, your health care provider may suggest repeat testing or other tests to confirm a diagnosis. Talk with your health care provider about what your results mean. Questions to ask your health care provider Ask your health care provider, or the department that is doing the test:  When will my results be ready?  How will I get my results?  What are my treatment options?  What other tests do I need?  What are my next steps? Summary  The oral glucose tolerance test (OGTT) is one of several tests used to diagnose diabetes that develops during pregnancy (gestational diabetes mellitus). Gestational diabetes is a short-term form of diabetes that some women develop while they are pregnant.  You may have the OGTT test after having a 1-hour glucose screening test if the results from that test show that you may have gestational diabetes. You may also have this test if you have any symptoms or risk factors for this type of diabetes.  Talk with your health care provider about what your results mean. This information is not intended to replace advice given to you by your health care provider. Make sure you discuss any questions you have with your health care provider. Document Revised: 05/06/2020 Document Reviewed: 05/06/2020 Elsevier Patient Education  2021 [employer] Inc[employer]  

## 2021-03-23 NOTE — Progress Notes (Signed)
ROB doing well. Feels good movement. 28 wks labs today. Tdap-declined  BTC/RSB completed. See check list. Discussed birth control after delivery, information given. Discussed birth plan, sample given. Plans water birth. Position statement from [facility] given. Discussed water birth class attendance. She verbalizes and agrees. Will follow in next few visit. All questions answered. ROB 2 wk with Marcelino Duster .    Doreene Burke, CNM

## 2021-03-24 ENCOUNTER — Encounter: Payer: Self-pay | Admitting: Certified Nurse Midwife

## 2021-03-24 DIAGNOSIS — O99019 Anemia complicating pregnancy, unspecified trimester: Secondary | ICD-10-CM | POA: Insufficient documentation

## 2021-03-24 LAB — RPR: RPR Ser Ql: NONREACTIVE

## 2021-03-24 LAB — GLUCOSE, 1 HOUR GESTATIONAL: Gestational Diabetes Screen: 106 mg/dL (ref 65–139)

## 2021-03-24 LAB — CBC
Hematocrit: 31.1 % — ABNORMAL LOW (ref 34.0–46.6)
Hemoglobin: 10.9 g/dL — ABNORMAL LOW (ref 11.1–15.9)
MCH: 28.2 pg (ref 26.6–33.0)
MCHC: 35 g/dL (ref 31.5–35.7)
MCV: 81 fL (ref 79–97)
Platelets: 360 10*3/uL (ref 150–450)
RBC: 3.86 x10E6/uL (ref 3.77–5.28)
RDW: 12.2 % (ref 11.7–15.4)
WBC: 9.3 10*3/uL (ref 3.4–10.8)

## 2021-04-07 ENCOUNTER — Encounter: Payer: Self-pay | Admitting: Certified Nurse Midwife

## 2021-04-07 ENCOUNTER — Other Ambulatory Visit: Payer: Self-pay

## 2021-04-07 ENCOUNTER — Ambulatory Visit (INDEPENDENT_AMBULATORY_CARE_PROVIDER_SITE_OTHER): Payer: BC Managed Care – PPO | Admitting: Certified Nurse Midwife

## 2021-04-07 VITALS — BP 113/77 | HR 102 | Wt 267.9 lb

## 2021-04-07 DIAGNOSIS — Z3A31 31 weeks gestation of pregnancy: Secondary | ICD-10-CM

## 2021-04-07 DIAGNOSIS — Z3483 Encounter for supervision of other normal pregnancy, third trimester: Secondary | ICD-10-CM

## 2021-04-07 DIAGNOSIS — O479 False labor, unspecified: Secondary | ICD-10-CM

## 2021-04-07 LAB — POCT URINALYSIS DIPSTICK OB
Bilirubin, UA: NEGATIVE
Blood, UA: NEGATIVE
Glucose, UA: NEGATIVE
Ketones, UA: NEGATIVE
Nitrite, UA: NEGATIVE
POC,PROTEIN,UA: NEGATIVE
Spec Grav, UA: 1.01 (ref 1.010–1.025)
Urobilinogen, UA: 0.2 E.U./dL
pH, UA: 7.5 (ref 5.0–8.0)

## 2021-04-07 NOTE — Progress Notes (Signed)
ROB-Doing well, reports [employer]. Discussed home treatment measures including use of abdominal support. Anticipatory guidance regarding course of prenatal care. Reviewed red flag symptoms and when to call. RTC x 2 weeks for ROB or sooner if needed.

## 2021-04-07 NOTE — Patient Instructions (Addendum)
Fetal Movement Counts Patient Name: ________________________________________________ Patient Due Date: ____________________  What is a fetal movement count? A fetal movement count is the number of times that you feel your baby move during a certain amount of time. This may also be called a fetal kick count. A fetal movement count is recommended for every pregnant woman. You may be asked to start counting fetal movements as early as week 28 of your pregnancy. Pay attention to when your baby is most active. You may notice your baby's sleep and wake cycles. You may also notice things that make your baby move more. You should do a fetal movement count:  When your baby is normally most active.  At the same time each day. A good time to count movements is while you are resting, after having something to eat and drink. How do I count fetal movements? 1. Find a quiet, comfortable area. Sit, or lie down on your side. 2. Write down the date, the start time and stop time, and the number of movements that you felt between those two times. Take this information with you to your health care visits. 3. Write down your start time when you feel the first movement. 4. Count kicks, flutters, swishes, rolls, and jabs. You should feel at least 10 movements. 5. You may stop counting after you have felt 10 movements, or if you have been counting for 2 hours. Write down the stop time. 6. If you do not feel 10 movements in 2 hours, contact your health care provider for further instructions. Your health care provider may want to do additional tests to assess your baby's well-being. Contact a health care provider if:  You feel fewer than 10 movements in 2 hours.  Your baby is not moving like he or she usually does. Date: ____________ Start time: ____________ Stop time: ____________ Movements: ____________ Date: ____________ Start time: ____________ Stop time: ____________ Movements: ____________ Date: ____________  Start time: ____________ Stop time: ____________ Movements: ____________ Date: ____________ Start time: ____________ Stop time: ____________ Movements: ____________ Date: ____________ Start time: ____________ Stop time: ____________ Movements: ____________ Date: ____________ Start time: ____________ Stop time: ____________ Movements: ____________ Date: ____________ Start time: ____________ Stop time: ____________ Movements: ____________ Date: ____________ Start time: ____________ Stop time: ____________ Movements: ____________ Date: ____________ Start time: ____________ Stop time: ____________ Movements: ____________ This information is not intended to replace advice given to you by your health care provider. Make sure you discuss any questions you have with your health care provider. Document Revised: 07/17/2019 Document Reviewed: 07/17/2019 Elsevier Patient Education  2021 [place] [employer] Pregnancy  The third trimester of pregnancy is from week 28 through week 3. This is also called months 7 through 9. This trimester is when your unborn baby (fetus) is growing very fast. At the end of the ninth month, the unborn baby is about 20 inches long. It weighs about 6-10 pounds. Body changes during your third trimester Your body continues to go through many changes during this time. The changes vary and generally return to normal after the baby is born. Physical changes  Your weight will continue to increase. You may gain 25-35 pounds (11-16 kg) by the end of the pregnancy. If you are underweight, you may gain 28-40 lb (about 13-18 kg). If you are overweight, you may gain 15-25 lb (about 7-11 kg).  You may start to get stretch marks on your hips, belly (abdomen), and breasts.  Your breasts will continue to grow and  may hurt. A yellow fluid (colostrum) may leak from your breasts. This is the first milk you are making for your baby.  You may have changes in your hair.  Your  belly button may stick out.  You may have more swelling in your hands, face, or ankles. Health changes  You may have heartburn.  You may have trouble pooping (constipation).  You may get hemorrhoids. These are swollen veins in the butt that can itch or get painful.  You may have swollen veins (varicose veins) in your legs.  You may have more body aches in the pelvis, back, or thighs.  You may have more tingling or numbness in your hands, arms, and legs. The skin on your belly may also feel numb.  You may feel short of breath as your womb (uterus) gets bigger. Other changes  You may pee (urinate) more often.  You may have more problems sleeping.  You may notice the unborn baby "dropping," or moving lower in your belly.  You may have more discharge coming from your vagina.  Your joints may feel loose, and you may have pain around your pelvic bone. Follow these instructions at home: Medicines  Take over-the-counter and prescription medicines only as told by your doctor. Some medicines are not safe during pregnancy.  Take a prenatal vitamin that contains at least 600 micrograms (mcg) of folic acid. Eating and drinking  Eat healthy meals that include: ? Fresh fruits and vegetables. ? Whole grains. ? Good sources of protein, such as meat, eggs, or tofu. ? Low-fat dairy products.  Avoid raw meat and unpasteurized juice, milk, and cheese. These carry germs that can harm you and your baby.  Eat 4 or 5 small meals rather than 3 large meals a day.  You may need to take these actions to prevent or treat trouble pooping: ? Drink enough fluids to keep your pee (urine) pale yellow. ? Eat foods that are high in fiber. These include beans, whole grains, and fresh fruits and vegetables. ? Limit foods that are high in fat and sugar. These include fried or sweet foods. Activity  Exercise only as told by your doctor. Stop exercising if you start to have cramps in your womb.  Avoid  heavy lifting.  Do not exercise if it is too hot or too humid, or if you are in a place of great height (high altitude).  If you choose to, you may have sex unless your doctor tells you not to. Relieving pain and discomfort  Take breaks often, and rest with your legs raised (elevated) if you have leg cramps or low back pain.  Take warm water baths (sitz baths) to soothe pain or discomfort caused by hemorrhoids. Use hemorrhoid cream if your doctor approves.  Wear a good support bra if your breasts are tender.  If you develop bulging, swollen veins in your legs: ? Wear support hose as told by your doctor. ? Raise your feet for 15 minutes, 3-4 times a day. ? Limit salt in your food. Safety  Talk to your doctor before traveling far distances.  Do not use hot tubs, steam rooms, or saunas.  Wear your seat belt at all times when you are in a car.  Talk with your doctor if someone is hurting you or yelling at you a lot. Preparing for your baby's arrival To prepare for the arrival of your baby:  Take prenatal classes.  Visit the hospital and tour the maternity area.  Buy a rear-facing  car seat. Learn how to install it in your car.  Prepare the baby's room. Take out all pillows and stuffed animals from the baby's crib. General instructions  Avoid cat litter boxes and soil used by cats. These carry germs that can cause harm to the baby and can cause a loss of your baby by miscarriage or stillbirth.  Do not douche or use tampons. Do not use scented sanitary pads.  Do not smoke or use any products that contain nicotine or tobacco. If you need help quitting, ask your doctor.  Do not drink alcohol.  Do not use herbal medicines, illegal drugs, or medicines that were not approved by your doctor. Chemicals in these products can affect your baby.  Keep all follow-up visits. This is important. Where to find more information  American Pregnancy Association:  americanpregnancy.org  [employer]: www.acog.org  Office on Women's Health: KeywordPortfolios.com.br Contact a doctor if:  You have a fever.  You have mild cramps or pressure in your lower belly.  You have a nagging pain in your belly area.  You vomit, or you have watery poop (diarrhea).  You have bad-smelling fluid coming from your vagina.  You have pain when you pee, or your pee smells bad.  You have a headache that does not go away when you take medicine.  You have changes in how you see, or you see spots in front of your eyes. Get help right away if:  Your water breaks.  You have regular contractions that are less than 5 minutes apart.  You are spotting or bleeding from your vagina.  You have very bad belly cramps or pain.  You have trouble breathing.  You have chest pain.  You faint.  You have not felt the baby move for the amount of time told by your doctor.  You have new or increased pain, swelling, or redness in an arm or leg. Summary  The third trimester is from week 28 through week 40 (months 7 through 9). This is the time when your unborn baby is growing very fast.  During this time, your discomfort may increase as you gain weight and as your baby grows.  Get ready for your baby to arrive by taking prenatal classes, buying a rear-facing car seat, and preparing the baby's room.  Get help right away if you are bleeding from your vagina, you have chest pain and trouble breathing, or you have not felt the baby move for the amount of time told by your doctor. This information is not intended to replace advice given to you by your health care provider. Make sure you discuss any questions you have with your health care provider. Document Revised: 05/05/2020 Document Reviewed: 03/11/2020 Elsevier Patient Education  [place].   [employer]'s Emergency Medicine: Concepts and Clinical Practice (9th ed., pp. 2296-  2312). Elsevier.">  Braxton Hicks Contractions Contractions of the uterus can occur throughout pregnancy, but they are not always a sign that you are in labor. You may have practice contractions called Braxton Hicks contractions. These false labor contractions are sometimes confused with true labor. What are [place] contractions? Braxton Hicks contractions are tightening movements that occur in the muscles of the uterus before labor. Unlike true labor contractions, these contractions do not result in opening (dilation) and thinning of the cervix. Toward the end of pregnancy (32-34 weeks), Braxton Hicks contractions can happen more often and may become stronger. These contractions are sometimes difficult to tell apart  from true labor because they can be very uncomfortable. You should not feel embarrassed if you go to the hospital with false labor. Sometimes, the only way to tell if you are in true labor is for your health care provider to look for changes in the cervix. The health care provider will do a physical exam and may monitor your contractions. If you are not in true labor, the exam should show that your cervix is not dilating and your water has not broken. If there are no other health problems associated with your pregnancy, it is completely safe for you to be sent home with false labor. You may continue to have Braxton Hicks contractions until you go into true labor. How to tell the difference between true labor and false labor True labor  Contractions last 30-70 seconds.  Contractions become very regular.  Discomfort is usually felt in the top of the uterus, and it spreads to the lower abdomen and low back.  Contractions do not go away with walking.  Contractions usually become more intense and increase in frequency.  The cervix dilates and gets thinner. False labor  Contractions are usually shorter and not as strong as true labor contractions.  Contractions are usually  irregular.  Contractions are often felt in the front of the lower abdomen and in the groin.  Contractions may go away when you walk around or change positions while lying down.  Contractions get weaker and are shorter-lasting as time goes on.  The cervix usually does not dilate or become thin. Follow these instructions at home:  Take over-the-counter and prescription medicines only as told by your health care provider.  Keep up with your usual exercises and follow other instructions from your health care provider.  Eat and drink lightly if you think you are going into labor.  If Braxton Hicks contractions are making you uncomfortable: ? Change your position from lying down or resting to walking, or change from walking to resting. ? Sit and rest in a tub of warm water. ? Drink enough fluid to keep your urine pale yellow. Dehydration may cause these contractions. ? Do slow and deep breathing several times an hour.  Keep all follow-up prenatal visits as told by your health care provider. This is important.   Contact a health care provider if:  You have a fever.  You have continuous pain in your abdomen. Get help right away if:  Your contractions become stronger, more regular, and closer together.  You have fluid leaking or gushing from your vagina.  You pass blood-tinged mucus (bloody show).  You have bleeding from your vagina.  You have low back pain that you never had before.  You feel your baby's head pushing down and causing pelvic pressure.  Your baby is not moving inside you as much as it used to. Summary  Contractions that occur before labor are called Braxton Hicks contractions, false labor, or practice contractions.  Braxton Hicks contractions are usually shorter, weaker, farther apart, and less regular than true labor contractions. True labor contractions usually become progressively stronger and regular, and they become more frequent.  Manage discomfort from  [facility] contractions by changing position, resting in a warm bath, drinking plenty of water, or practicing deep breathing. This information is not intended to replace advice given to you by your health care provider. Make sure you discuss any questions you have with your health care provider. Document Revised: 11/09/2017 Document Reviewed: 04/12/2017 Elsevier Patient Education  2021 Elsevier  Inc.   Common Medications Safe in Pregnancy  Acne:      Constipation:  Benzoyl Peroxide     Colace  Clindamycin      Dulcolax Suppository  [place]ica Erythromycin     Fibercon  Salicylic Acid      Metamucil         Miralax AVOID:        Senakot   Accutane    Cough:  Retin-A       Cough Drops  Tetracycline      Phenergan w/ Codeine if Rx  Minocycline      Robitussin (Plain & DM)  Antibiotics:     Crabs/Lice:  Ceclor       RID  Cephalosporins    AVOID:  E-Mycins      Kwell  Keflex  Macrobid/Macrodantin   Diarrhea:  Penicillin      Kao-Pectate  Zithromax      Imodium AD         PUSH FLUIDS AVOID:       Cipro     Fever:  Tetracycline      Tylenol (Regular or Extra  Minocycline       Strength)  Levaquin      Extra Strength-Do not          Exceed 8 tabs/24 hrs Caffeine:        <238m/day (equiv. To 1 cup of coffee or  approx. 3 12 oz sodas)         Gas: Cold/Hayfever:       Gas-X  Benadryl      Mylicon  Claritin       Phazyme  **Claritin-D        Chlor-Trimeton    Headaches:  Dimetapp      ASA-Free Excedrin  Drixoral-Non-Drowsy     Cold Compress  Mucinex (Guaifenasin)     Tylenol (Regular or Extra  Sudafed/Sudafed-12 Hour     Strength)  **Sudafed PE Pseudoephedrine   Tylenol Cold & Sinus     Vicks Vapor Rub  Zyrtec  **AVOID if Problems With Blood Pressure         Heartburn: Avoid lying down for at least 1 hour after meals  Aciphex      Maalox     Rash:  Milk of Magnesia     Benadryl    Mylanta       1% Hydrocortisone Cream  Pepcid  Pepcid Complete   Sleep  Aids:  Prevacid      Ambien   Prilosec       Benadryl  Rolaids       Chamomile Tea  Tums (Limit 4/day)     Unisom         Tylenol PM         Warm milk-add vanilla or  Hemorrhoids:       Sugar for taste  Anusol/Anusol H.C.  (RX: Analapram 2.5%)  Sugar Substitutes:  Hydrocortisone OTC     Ok in moderation  Preparation H      Tucks        Vaseline lotion applied to tissue with wiping    Herpes:     Throat:  Acyclovir      Oragel  Famvir  Valtrex     Vaccines:         Flu Shot Leg Cramps:       *Gardasil  Benadryl      Hepatitis A         Hepatitis  B Nasal Spray:       Pneumovax  Saline Nasal Spray     Polio Booster         Tetanus Nausea:       Tuberculosis test or PPD  Vitamin B6 25 mg TID   AVOID:    Dramamine      *Gardasil  Emetrol       Live Poliovirus  Ginger Root 250 mg QID    MMR (measles, mumps &  High Complex Carbs @ Bedtime    rebella)  Sea Bands-Accupressure    Varicella (Chickenpox)  Unisom 1/2 tab TID     *No known complications           If received before Pain:         Known pregnancy;   Darvocet       Resume series after  Lortab        Delivery  Percocet    Yeast:   Tramadol      Femstat  Tylenol 3      Gyne-lotrimin  Ultram       Monistat  Vicodin           MISC:         All Sunscreens           Hair Coloring/highlights          Insect Repellant's          (Including DEET)         Mystic Tans

## 2021-04-07 NOTE — Progress Notes (Signed)
OB-pt present for routine prenatal care. Pt stated having braxton hick contractions.

## 2021-04-21 ENCOUNTER — Other Ambulatory Visit: Payer: Self-pay

## 2021-04-21 ENCOUNTER — Ambulatory Visit (INDEPENDENT_AMBULATORY_CARE_PROVIDER_SITE_OTHER): Payer: BC Managed Care – PPO | Admitting: Certified Nurse Midwife

## 2021-04-21 VITALS — BP 103/66 | HR 94 | Wt 265.3 lb

## 2021-04-21 DIAGNOSIS — Z3A33 33 weeks gestation of pregnancy: Secondary | ICD-10-CM

## 2021-04-21 DIAGNOSIS — Z3403 Encounter for supervision of normal first pregnancy, third trimester: Secondary | ICD-10-CM

## 2021-04-21 DIAGNOSIS — B009 Herpesviral infection, unspecified: Secondary | ICD-10-CM

## 2021-04-21 LAB — POCT URINALYSIS DIPSTICK OB
Bilirubin, UA: NEGATIVE
Glucose, UA: NEGATIVE
Ketones, UA: NEGATIVE
Leukocytes, UA: NEGATIVE
Nitrite, UA: NEGATIVE
POC,PROTEIN,UA: NEGATIVE
Spec Grav, UA: 1.015 (ref 1.010–1.025)
Urobilinogen, UA: 0.2 E.U./dL
pH, UA: 6.5 (ref 5.0–8.0)

## 2021-04-21 MED ORDER — VALACYCLOVIR HCL 500 MG PO TABS: 500.0000 mg | ORAL_TABLET | Freq: Two times a day (BID) | ORAL | 6 refills | Status: DC

## 2021-04-21 NOTE — Patient Instructions (Addendum)
Postpartum Tubal Ligation Postpartum tubal ligation (PPTL) is a procedure to close the fallopian tubes. This is done to prevent pregnancy. When the fallopian tubes are closed, the eggs that the ovaries release cannot enter the uterus, and sperm cannot reach the eggs. PPTL is done right after childbirth or 1-2 days after childbirth, before the uterus returns to its normal position. If you have a cesarean section, it can be performed at the same time as the procedure. Having this done after childbirth does not make your stay in the hospital longer. PPTL is sometimes called "getting your tubes tied." You should not have this procedure if you want to get pregnant again or if you are unsure about having more children. Tell a health care provider about:  Any allergies you have.  All medicines you are taking, including vitamins, herbs, eye drops, creams, and over-the-counter medicines.  Any problems you or family members have had with anesthetic medicines.  Any blood disorders you have.  Any surgeries you have had.  Any medical conditions you have or have had.  Any past pregnancies. What are the risks? Generally, this is a safe procedure. However, problems may occur, including:  Infection.  Bleeding.  Damage to other organs in the abdomen.  Side effects from anesthetic medicines.  Failure of the procedure. If this happens, you could get pregnant.  Ectopic pregnancy. This is a pregnancy in which the egg attaches outside the uterus. What happens before the procedure? Ask your health care provider about:  How much pain you can expect to have.  What medicines you will be given for pain, especially if you are breastfeeding. What happens during the procedure? If you had a vaginal delivery:  An IV will be inserted into one of your veins.  You will be given one or more of the following: ? A medicine to help you relax (sedative). ? A medicine to numb the area (local anesthetic). ? A  medicine to make you fall asleep (general anesthetic). ? A medicine that is injected into an area of your body to numb everything below the injection site (regional anesthetic).  If you have been given a general anesthetic, a tube will be put down your throat to help you breathe.  Your bladder may be emptied with a small tube (catheter).  An incision will be made just below your belly button.  Your fallopian tubes will be located and brought up through the incision.  Your fallopian tubes will be tied off, burned (cauterized), or blocked with a clip, ring, or clamp. A small part in the center of each fallopian tube may be removed.  The incision will be closed with stitches (sutures).  A bandage (dressing) will be placed over the incision. If you had a cesarean delivery:  Tubal ligation will be done through the incision that was used for the cesarean delivery of your baby.  The incision will be closed with sutures.  A dressing will be placed over the incision. The procedure may vary among health care providers and hospitals.   What happens after the procedure?  Your blood pressure, heart rate, breathing rate, and blood oxygen level will be monitored until you leave the hospital.  You will be given pain medicine as needed.  You will be encouraged to get up early and walk to prevent blood clots.  If you were given a sedative during the procedure, it can affect you for several hours. Do not drive or operate machinery until your health care provider says  that it is safe. Summary  Postpartum tubal ligation is a procedure that closes the fallopian tubes to prevent pregnancy.  This procedure is done while you are still in the hospital after childbirth. If you have a cesarean section, it can be performed at the same time.  Having this done after childbirth does not make your stay in the hospital longer.  Postpartum tubal ligation is considered permanent. You should not have this  procedure if you want to get pregnant again or if you are unsure about having more children.  Talk to your health care provider to see if this procedure is right for you. This information is not intended to replace advice given to you by your health care provider. Make sure you discuss any questions you have with your health care provider. Document Revised: 08/13/2020 Document Reviewed: 08/13/2020 Elsevier Patient Education  2021 Elsevier Inc.   Fetal Movement Counts Patient Name: ________________________________________________ Patient Due Date: ____________________  What is a fetal movement count? A fetal movement count is the number of times that you feel your baby move during a certain amount of time. This may also be called a fetal kick count. A fetal movement count is recommended for every pregnant woman. You may be asked to start counting fetal movements as early as week 28 of your pregnancy. Pay attention to when your baby is most active. You may notice your baby's sleep and wake cycles. You may also notice things that make your baby move more. You should do a fetal movement count:  When your baby is normally most active.  At the same time each day. A good time to count movements is while you are resting, after having something to eat and drink. How do I count fetal movements? 1. Find a quiet, comfortable area. Sit, or lie down on your side. 2. Write down the date, the start time and stop time, and the number of movements that you felt between those two times. Take this information with you to your health care visits. 3. Write down your start time when you feel the first movement. 4. Count kicks, flutters, swishes, rolls, and jabs. You should feel at least 10 movements. 5. You may stop counting after you have felt 10 movements, or if you have been counting for 2 hours. Write down the stop time. 6. If you do not feel 10 movements in 2 hours, contact your health care provider for  further instructions. Your health care provider may want to do additional tests to assess your baby's well-being. Contact a health care provider if:  You feel fewer than 10 movements in 2 hours.  Your baby is not moving like he or she usually does. Date: ____________ Start time: ____________ Stop time: ____________ Movements: ____________ Date: ____________ Start time: ____________ Stop time: ____________ Movements: ____________ Date: ____________ Start time: ____________ Stop time: ____________ Movements: ____________ Date: ____________ Start time: ____________ Stop time: ____________ Movements: ____________ Date: ____________ Start time: ____________ Stop time: ____________ Movements: ____________ Date: ____________ Start time: ____________ Stop time: ____________ Movements: ____________ Date: ____________ Start time: ____________ Stop time: ____________ Movements: ____________ Date: ____________ Start time: ____________ Stop time: ____________ Movements: ____________ Date: ____________ Start time: ____________ Stop time: ____________ Movements: ____________ This information is not intended to replace advice given to you by your health care provider. Make sure you discuss any questions you have with your health care provider. Document Revised: 07/17/2019 Document Reviewed: 07/17/2019 Elsevier Patient Education  2021 [employer].

## 2021-04-21 NOTE — Progress Notes (Signed)
ROB: She had some abdominal pain on Saturday, pain resolved by next morning. She has no other new concerns.

## 2021-04-22 NOTE — Progress Notes (Signed)
ROB-Reports BHCs. Discussed home treatment measures. 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 Waterbirth class completed, certificate submitted. Anticipatory guidance regarding course of prenatal care. Reviewed red flag symptoms and when to call. RTC x 2 weeks for ROB with JML or sooner if needed.

## 2021-04-27 ENCOUNTER — Telehealth: Payer: Self-pay | Admitting: Certified Nurse Midwife

## 2021-04-27 NOTE — Telephone Encounter (Signed)
Note has been faxed.

## 2021-04-27 NOTE — Telephone Encounter (Signed)
Anne Clark called in and states she needs a dental form faxed or emailed to her dentist office stating what procedures she can and can't do.  She would like it faxed to [phone] or emailed to [email or web address]

## 2021-05-05 ENCOUNTER — Encounter: Payer: Self-pay | Admitting: Certified Nurse Midwife

## 2021-05-05 ENCOUNTER — Ambulatory Visit (INDEPENDENT_AMBULATORY_CARE_PROVIDER_SITE_OTHER): Payer: BC Managed Care – PPO | Admitting: Certified Nurse Midwife

## 2021-05-05 ENCOUNTER — Other Ambulatory Visit: Payer: Self-pay

## 2021-05-05 VITALS — BP 109/74 | HR 97 | Wt 265.6 lb

## 2021-05-05 DIAGNOSIS — Z3483 Encounter for supervision of other normal pregnancy, third trimester: Secondary | ICD-10-CM

## 2021-05-05 DIAGNOSIS — Z3A35 35 weeks gestation of pregnancy: Secondary | ICD-10-CM

## 2021-05-05 DIAGNOSIS — Z3403 Encounter for supervision of normal first pregnancy, third trimester: Secondary | ICD-10-CM

## 2021-05-05 LAB — POCT URINALYSIS DIPSTICK OB
Bilirubin, UA: NEGATIVE
Blood, UA: POSITIVE
Glucose, UA: NEGATIVE
Ketones, UA: NEGATIVE
Leukocytes, UA: NEGATIVE
Nitrite, UA: NEGATIVE
Spec Grav, UA: 1.015 (ref 1.010–1.025)
Urobilinogen, UA: 0.2 E.U./dL
pH, UA: 6.5 (ref 5.0–8.0)

## 2021-05-05 NOTE — Patient Instructions (Signed)
Fetal Movement Counts Patient Name: ________________________________________________ Patient Due Date: ____________________  What is a fetal movement count? A fetal movement count is the number of times that you feel your baby move during a certain amount of time. This may also be called a fetal kick count. A fetal movement count is recommended for every pregnant woman. You may be asked to start counting fetal movements as early as week 28 of your pregnancy. Pay attention to when your baby is most active. You may notice your baby's sleep and wake cycles. You may also notice things that make your baby move more. You should do a fetal movement count:  When your baby is normally most active.  At the same time each day. A good time to count movements is while you are resting, after having something to eat and drink. How do I count fetal movements? 1. Find a quiet, comfortable area. Sit, or lie down on your side. 2. Write down the date, the start time and stop time, and the number of movements that you felt between those two times. Take this information with you to your health care visits. 3. Write down your start time when you feel the first movement. 4. Count kicks, flutters, swishes, rolls, and jabs. You should feel at least 10 movements. 5. You may stop counting after you have felt 10 movements, or if you have been counting for 2 hours. Write down the stop time. 6. If you do not feel 10 movements in 2 hours, contact your health care provider for further instructions. Your health care provider may want to do additional tests to assess your baby's well-being. Contact a health care provider if:  You feel fewer than 10 movements in 2 hours.  Your baby is not moving like he or she usually does. Date: ____________ Start time: ____________ Stop time: ____________ Movements: ____________ Date: ____________ Start time: ____________ Stop time: ____________ Movements: ____________ Date: ____________  Start time: ____________ Stop time: ____________ Movements: ____________ Date: ____________ Start time: ____________ Stop time: ____________ Movements: ____________ Date: ____________ Start time: ____________ Stop time: ____________ Movements: ____________ Date: ____________ Start time: ____________ Stop time: ____________ Movements: ____________ Date: ____________ Start time: ____________ Stop time: ____________ Movements: ____________ Date: ____________ Start time: ____________ Stop time: ____________ Movements: ____________ Date: ____________ Start time: ____________ Stop time: ____________ Movements: ____________ This information is not intended to replace advice given to you by your health care provider. Make sure you discuss any questions you have with your health care provider. Document Revised: 07/17/2019 Document Reviewed: 07/17/2019 Elsevier Patient Education  2021 Elsevier Inc.  

## 2021-05-05 NOTE — Progress Notes (Signed)
ROB: Doing well, no new concerns. 

## 2021-05-06 ENCOUNTER — Other Ambulatory Visit: Payer: Self-pay | Admitting: Certified Nurse Midwife

## 2021-05-06 ENCOUNTER — Other Ambulatory Visit: Payer: Self-pay

## 2021-05-06 ENCOUNTER — Telehealth: Payer: Self-pay | Admitting: Certified Nurse Midwife

## 2021-05-06 ENCOUNTER — Observation Stay
Admission: EM | Admit: 2021-05-06 | Discharge: 2021-05-06 | Disposition: A | Discharge status: Home / Self Care | Payer: BC Managed Care – PPO | Attending: Certified Nurse Midwife | Admitting: Certified Nurse Midwife

## 2021-05-06 ENCOUNTER — Encounter: Payer: Self-pay | Admitting: Certified Nurse Midwife

## 2021-05-06 DIAGNOSIS — O09523 Supervision of elderly multigravida, third trimester: Principal | ICD-10-CM | POA: Insufficient documentation

## 2021-05-06 DIAGNOSIS — O26893 Other specified pregnancy related conditions, third trimester: Secondary | ICD-10-CM

## 2021-05-06 DIAGNOSIS — R2240 Localized swelling, mass and lump, unspecified lower limb: Secondary | ICD-10-CM | POA: Diagnosis not present

## 2021-05-06 DIAGNOSIS — H538 Other visual disturbances: Secondary | ICD-10-CM | POA: Diagnosis not present

## 2021-05-06 DIAGNOSIS — R519 Headache, unspecified: Secondary | ICD-10-CM | POA: Diagnosis not present

## 2021-05-06 DIAGNOSIS — O26899 Other specified pregnancy related conditions, unspecified trimester: Secondary | ICD-10-CM | POA: Diagnosis present

## 2021-05-06 DIAGNOSIS — Z3483 Encounter for supervision of other normal pregnancy, third trimester: Secondary | ICD-10-CM

## 2021-05-06 DIAGNOSIS — O99353 Diseases of the nervous system complicating pregnancy, third trimester: Secondary | ICD-10-CM | POA: Diagnosis not present

## 2021-05-06 DIAGNOSIS — R1013 Epigastric pain: Secondary | ICD-10-CM | POA: Insufficient documentation

## 2021-05-06 DIAGNOSIS — Z3A35 35 weeks gestation of pregnancy: Secondary | ICD-10-CM | POA: Insufficient documentation

## 2021-05-06 DIAGNOSIS — O1203 Gestational edema, third trimester: Secondary | ICD-10-CM | POA: Diagnosis present

## 2021-05-06 DIAGNOSIS — O99013 Anemia complicating pregnancy, third trimester: Secondary | ICD-10-CM

## 2021-05-06 LAB — COMPREHENSIVE METABOLIC PANEL
ALT: 14 U/L (ref 0–44)
AST: 21 U/L (ref 15–41)
Albumin: 3.1 g/dL — ABNORMAL LOW (ref 3.5–5.0)
Alkaline Phosphatase: 64 U/L (ref 38–126)
Anion gap: 7 (ref 5–15)
BUN: 5 mg/dL — ABNORMAL LOW (ref 6–20)
CO2: 21 mmol/L — ABNORMAL LOW (ref 22–32)
Calcium: 8.8 mg/dL — ABNORMAL LOW (ref 8.9–10.3)
Chloride: 107 mmol/L (ref 98–111)
Creatinine, Ser: 0.51 mg/dL (ref 0.44–1.00)
GFR, Estimated: >60 mL/min (ref >60)
Glucose, Bld: 131 mg/dL — ABNORMAL HIGH (ref 70–99)
Potassium: 4.3 mmol/L (ref 3.5–5.1)
Sodium: 135 mmol/L (ref 135–145)
Total Bilirubin: 0.6 mg/dL (ref 0.3–1.2)
Total Protein: 6.8 g/dL (ref 6.5–8.1)

## 2021-05-06 LAB — PROTEIN / CREATININE RATIO, URINE
Creatinine, Urine: 137 mg/dL
Protein Creatinine Ratio: 0.14 mg/mg{Cre} (ref 0.00–0.15)
Total Protein, Urine: 19 mg/dL

## 2021-05-06 LAB — CBC
HCT: 32.8 % — ABNORMAL LOW (ref 36.0–46.0)
Hemoglobin: 11 g/dL — ABNORMAL LOW (ref 12.0–15.0)
MCH: 27.3 pg (ref 26.0–34.0)
MCHC: 33.5 g/dL (ref 30.0–36.0)
MCV: 81.4 fL (ref 80.0–100.0)
Platelets: 342 10*3/uL (ref 150–400)
RBC: 4.03 MIL/uL (ref 3.87–5.11)
RDW: 13 % (ref 11.5–15.5)
WBC: 8.4 10*3/uL (ref 4.0–10.5)
nRBC: 0 % (ref 0.0–0.2)

## 2021-05-06 MED ORDER — LABETALOL HCL 5 MG/ML IV SOLN: 40.0000 mg | INTRAVENOUS | Status: DC | PRN

## 2021-05-06 MED ORDER — NIFEDIPINE 10 MG PO CAPS: 20.0000 mg | ORAL_CAPSULE | ORAL | Status: DC | PRN

## 2021-05-06 MED ORDER — MAGNESIUM OXIDE 400 MG PO CAPS: 1.0000 | ORAL_CAPSULE | Freq: Two times a day (BID) | ORAL | 1 refills | Status: DC | PRN

## 2021-05-06 MED ORDER — NIFEDIPINE 10 MG PO CAPS: 10.0000 mg | ORAL_CAPSULE | ORAL | Status: DC | PRN

## 2021-05-06 NOTE — OB Triage Note (Signed)
Pt Anne Clark 35 y.o. presents to the ED after being sent by M.Lawhorn, CNM for [facility] workup. Pt reports headache and rates 7/10 pain and pt took 1000mg  of Tylenol at 1430 and had relief, rating HA 4/10. Pt also reports blurry vision with occasional floaters, 5/10 occasional sharp pain in RUQ epigastric, and occasional 5/10 pelvic and back pressure pain . Pt is a at [redacted]w[redacted]d . Pt denies signs and symptons consistent with rupture of membranes or active vaginal bleeding. Pt denies contractions and states positive fetal movement. On assessment pt has generalized edema with +1 pitting edema in lower extremities, +2 reflexes, no clonus, and lungs clear. Pt does report occasional braxton hicks. External FM and TOCO applied to non-tender abdomen and assessing. Initial FHR 160 . Vital signs obtained, initial BP 120/65 with BP repeating q15 minutes. PIH labs released and collected. Provider notified of pt.

## 2021-05-06 NOTE — Discharge Summary (Signed)
Obstetric Discharge Summary  Patient ID: Anne Clark MRN: [id number] DOB/AGE: 36-Oct-1987 36 y.o.   Date of Admission: 05/06/2021  Date of Discharge:  05/06/21  Admitting Diagnosis: Observation at [redacted]w[redacted]d  Secondary Diagnosis:   Patient Active Problem List   Diagnosis Date Noted  . Indication for care in labor and delivery, antepartum 05/06/2021  . Headache in pregnancy 05/06/2021  . Blurred vision 05/06/2021  . Leg swelling in pregnancy in third trimester 05/06/2021  . Anemia in pregnancy 03/24/2021  . Low grade squamous intraepithelial lesion (LGSIL) on cervical Pap smear 08/19/2014       Discharge Diagnosis: No other diagnosis   Antepartum Procedures: NST    Brief Hospital Course   L&D OB Triage Note  Anne Clark is a 35 y.o. [id number] female at [redacted]w[redacted]d, EDD Estimated Date of Delivery: 06/09/21 who presented to triage for complaints of headache, epigastric pain, leg swelling, and blurred vision.  She was evaluated by the nurses with no significant findings for pre-eclampisa. Vital signs stable. An NST was performed and has been reviewed by CNM.   NST INTERPRETATION: Indications: rule out uterine contractions  Mode: External Baseline Rate (A): 150 bpm Variability: Moderate Accelerations: 15 x 15 Decelerations: None Contraction Frequency (min): one noted  Impression: reactive   Plan: NST performed was reviewed and was found to be reactive. Lab results were discussed with patient and her mother; verbalized understanding. She was discharged home with bleeding/labor or blood pressure precautions.  Continue routine prenatal care. Follow up with CNM as previously scheduled.    Discharge Instructions: Per After Visit Summary.  Activity: Advance as tolerated. Also refer to After Visit Summary.  Diet: Regular  Medications: Allergies as of 05/06/2021   No Known Allergies     Medication List    TAKE these medications   ergocalciferol 1.25 MG (50000 UT)  capsule Commonly known as: VITAMIN D2 Take 50,000 Units by mouth once a week.   famotidine 20 MG tablet Commonly known as: PEPCID Take 1 tablet (20 mg total) by mouth 2 (two) times daily.   Magnesium Oxide 400 MG Caps Take 1 capsule (400 mg total) by mouth 2 (two) times daily as needed (headache prevention).   prenatal multivitamin Tabs tablet Take 1 tablet by mouth daily at 12 noon.   valACYclovir 500 MG tablet Commonly known as: VALTREX Take 1 tablet (500 mg total) by mouth 2 (two) times daily.      Outpatient follow up:   Follow-up Information    Gunnar Bulla, CNM Follow up.   Specialties: Certified Nurse Midwife, Obstetrics and Gynecology, Radiology Why: Please keep your appointment as scheduled Contact information: [address] [place] [address] [phone]               Discharged Condition: stable  Discharged to: home   Serafina Royals, CNM  [facility], [facility] 05/06/21 9:08 PM

## 2021-05-06 NOTE — Telephone Encounter (Signed)
Telephone call to patient, verified full name and date of birth.   Reports blurred vision (updated prescription) when looking down, headache that is resolving with pregnancy, back pain and lower leg swelling.   Limited assessment. Referred to labor and delivery for further evaluation. Patient states she's in [place]; encouraged to remote to nearest hospital for evaluation. Patient states "she wouldn't go there for stitches".   Plans to return to [place]. Reviewed red flag symptoms.   Report called to [place] Estate manager/land agent.    Serafina Royals, CNM [facility], [facility] 05/06/21 2:23 PM

## 2021-05-06 NOTE — Progress Notes (Signed)
ROB-Reports increased pelvic pressure and leg swelling. Discussed home treatment measures including use of V2 supporter. UA results discussed and encouraged increased dietary protein. Anticipatory guidance regarding course of prenatal care. Reviewed red flag symptoms and when to call. RTC x 1 week for 36 week cultures and ROB with JML or sooner if needed.

## 2021-05-06 NOTE — Discharge Instructions (Signed)
Third Trimester of Pregnancy  The third trimester of pregnancy is from week 28 through week 40. This is also called months 7 through 9. This trimester is when your unborn baby (fetus) is growing very fast. At the end of the ninth month, the unborn baby is about 20 inches long. It weighs about 6-10 pounds. Body changes during your third trimester Your body continues to go through many changes during this time. The changes vary and generally return to normal after the baby is born. Physical changes  Your weight will continue to increase. You may gain 25-35 pounds (11-16 kg) by the end of the pregnancy. If you are underweight, you may gain 28-40 lb (about 13-18 kg). If you are overweight, you may gain 15-25 lb (about 7-11 kg).  You may start to get stretch marks on your hips, belly (abdomen), and breasts.  Your breasts will continue to grow and may hurt. A yellow fluid (colostrum) may leak from your breasts. This is the first milk you are making for your baby.  You may have changes in your hair.  Your belly button may stick out.  You may have more swelling in your hands, face, or ankles. Health changes  You may have heartburn.  You may have trouble pooping (constipation).  You may get hemorrhoids. These are swollen veins in the butt that can itch or get painful.  You may have swollen veins (varicose veins) in your legs.  You may have more body aches in the pelvis, back, or thighs.  You may have more tingling or numbness in your hands, arms, and legs. The skin on your belly may also feel numb.  You may feel short of breath as your womb (uterus) gets bigger. Other changes  You may pee (urinate) more often.  You may have more problems sleeping.  You may notice the unborn baby "dropping," or moving lower in your belly.  You may have more discharge coming from your vagina.  Your joints may feel loose, and you may have pain around your pelvic bone. Follow these instructions at  home: Medicines  Take over-the-counter and prescription medicines only as told by your doctor. Some medicines are not safe during pregnancy.  Take a prenatal vitamin that contains at least 600 micrograms (mcg) of folic acid. Eating and drinking  Eat healthy meals that include: ? Fresh fruits and vegetables. ? Whole grains. ? Good sources of protein, such as meat, eggs, or tofu. ? Low-fat dairy products.  Avoid raw meat and unpasteurized juice, milk, and cheese. These carry germs that can harm you and your baby.  Eat 4 or 5 small meals rather than 3 large meals a day.  You may need to take these actions to prevent or treat trouble pooping: ? Drink enough fluids to keep your pee (urine) pale yellow. ? Eat foods that are high in fiber. These include beans, whole grains, and fresh fruits and vegetables. ? Limit foods that are high in fat and sugar. These include fried or sweet foods. Activity  Exercise only as told by your doctor. Stop exercising if you start to have cramps in your womb.  Avoid heavy lifting.  Do not exercise if it is too hot or too humid, or if you are in a place of great height (high altitude).  If you choose to, you may have sex unless your doctor tells you not to. Relieving pain and discomfort  Take breaks often, and rest with your legs raised (elevated) if you have   leg cramps or low back pain.  Take warm water baths (sitz baths) to soothe pain or discomfort caused by hemorrhoids. Use hemorrhoid cream if your doctor approves.  Wear a good support bra if your breasts are tender.  If you develop bulging, swollen veins in your legs: ? Wear support hose as told by your doctor. ? Raise your feet for 15 minutes, 3-4 times a day. ? Limit salt in your food. Safety  Talk to your doctor before traveling far distances.  Do not use hot tubs, steam rooms, or saunas.  Wear your seat belt at all times when you are in a car.  Talk with your doctor if someone is  hurting you or yelling at you a lot. Preparing for your baby's arrival To prepare for the arrival of your baby:  Take prenatal classes.  Visit the hospital and tour the maternity area.  Buy a rear-facing car seat. Learn how to install it in your car.  Prepare the baby's room. Take out all pillows and stuffed animals from the baby's crib. General instructions  Avoid cat litter boxes and soil used by cats. These carry germs that can cause harm to the baby and can cause a loss of your baby by miscarriage or stillbirth.  Do not douche or use tampons. Do not use scented sanitary pads.  Do not smoke or use any products that contain nicotine or tobacco. If you need help quitting, ask your doctor.  Do not drink alcohol.  Do not use herbal medicines, illegal drugs, or medicines that were not approved by your doctor. Chemicals in these products can affect your baby.  Keep all follow-up visits. This is important. Where to find more information  American Pregnancy Association: americanpregnancy.org  American College of Obstetricians and Gynecologists: www.acog.org  Office on Women's Health: womenshealth.gov/pregnancy Contact a doctor if:  You have a fever.  You have mild cramps or pressure in your lower belly.  You have a nagging pain in your belly area.  You vomit, or you have watery poop (diarrhea).  You have bad-smelling fluid coming from your vagina.  You have pain when you pee, or your pee smells bad.  You have a headache that does not go away when you take medicine.  You have changes in how you see, or you see spots in front of your eyes. Get help right away if:  Your water breaks.  You have regular contractions that are less than 5 minutes apart.  You are spotting or bleeding from your vagina.  You have very bad belly cramps or pain.  You have trouble breathing.  You have chest pain.  You faint.  You have not felt the baby move for the amount of time told by  your doctor.  You have new or increased pain, swelling, or redness in an arm or leg. Summary  The third trimester is from week 28 through week 40 (months 7 through 9). This is the time when your unborn baby is growing very fast.  During this time, your discomfort may increase as you gain weight and as your baby grows.  Get ready for your baby to arrive by taking prenatal classes, buying a rear-facing car seat, and preparing the baby's room.  Get help right away if you are bleeding from your vagina, you have chest pain and trouble breathing, or you have not felt the baby move for the amount of time told by your doctor. This information is not intended to replace advice given   to you by your health care provider. Make sure you discuss any questions you have with your health care provider. Document Revised: 05/05/2020 Document Reviewed: 03/11/2020 Elsevier Patient Education  2021 Elsevier Inc.   Preeclampsia and Eclampsia Preeclampsia is a serious condition that may develop during pregnancy. This condition involves high blood pressure during pregnancy and causes symptoms such as headaches, vision changes, and increased swelling in the legs, hands, and face. Preeclampsia occurs after 20 weeks of pregnancy. Eclampsia is a seizure that happens from worsening preeclampsia. Diagnosing and managing preeclampsia early is important. If not treated early, it can cause serious problems for mother and baby. There is no cure for this condition. However, during pregnancy, delivering the baby may be the best treatment for preeclampsia or eclampsia. For most women, symptoms of preeclampsia and eclampsia go away after giving birth. In rare cases, a woman may develop preeclampsia or eclampsia after giving birth. This usually occurs within 48 hours after childbirth but may occur up to 6 weeks after giving birth. What are the causes? The cause of this condition is not known. What increases the risk? The following  factors make you more likely to develop preeclampsia:  Being pregnant for the first time or being pregnant with multiples.  Having had preeclampsia or a condition called hemolysis, elevated liver enzymes, and low platelet count (HELLP)syndrome during a past pregnancy.  Having a family history of preeclampsia.  Being older than age 70.  Being obese.  Becoming pregnant through fertility treatments. Conditions that reduce blood flow or oxygen to your placenta and baby may also increase your risk. These include:  High blood pressure before, during, or immediately following pregnancy.  Kidney disease.  Diabetes.  Blood clotting disorders.  Autoimmune diseases, such as lupus.  Sleep apnea. What are the signs or symptoms? Common symptoms of this condition include:  A severe, throbbing headache that does not go away.  Vision problems, such as blurred or double vision and light sensitivity.  Pain in the stomach, especially the right upper region.  Pain in the shoulder. Other symptoms that may develop as the condition gets worse include:  Sudden weight gain because of fluid buildup in the body. This causes swelling of the face, hands, legs, and feet.  Severe nausea and vomiting.  Urinating less than usual.  Shortness of breath.  Seizures. How is this diagnosed? Your health care provider will ask you about symptoms and check for signs of preeclampsia during your prenatal visits. You will also have routine tests, including:  Checking your blood pressure.  Urine tests to check for protein.  Blood tests to assess your organ function.  Monitoring your baby's heart rate.  Ultrasounds to check fetal growth.   How is this treated? You and your health care provider will determine the treatment that is best for you. Treatment may include:  Frequent prenatal visits to check for preeclampsia.  Medicine to lower your blood pressure.  Medicine to prevent  seizures.  Low-dose aspirin during your pregnancy.  Staying in the hospital, in severe cases. You will be given medicines to control your blood pressure and the amount of fluids in your body.  Delivering your baby. Work with your health care provider to manage any chronic health conditions, such as diabetes or kidney problems. Also, work with your health care provider to manage weight gain during pregnancy. Follow these instructions at home: Eating and drinking  Drink enough fluid to keep your urine pale yellow.  Avoid caffeine. Caffeine may increase blood  pressure and heart rate and lead to dehydration.  Reduce the amount of salt that you eat. Lifestyle  Do not use any products that contain nicotine or tobacco. These products include cigarettes, chewing tobacco, and vaping devices, such as e-cigarettes. If you need help quitting, ask your health care provider.  Do not use alcohol or drugs.  Avoid stress as much as possible.  Rest and get plenty of sleep. General instructions  Take over-the-counter and prescription medicines only as told by your health care provider.  When lying down, lie on your left side. This keeps pressure off your major blood vessels.  When sitting or lying down, raise (elevate) your feet. Try putting pillows underneath your lower legs.  Exercise regularly. Ask your health care provider what kinds of exercise are best for you.  Check your blood pressure as often as recommended by your health care provider.  Keep all prenatal and follow-up visits. This is important.   Contact a health care provider if:  You have symptoms that may need treatment or closer monitoring. These include: ? Headaches. ? Stomach pain or nausea and vomiting. ? Shoulder pain. ? Vision problems, such as spots in front of your eyes or blurry vision. ? Sudden weight gain or increased swelling in your face, hands, legs, and feet. ? Increased anxiety or feeling of impending  doom. ? Signs or symptoms of labor. Get help right away if:  You have any of the following symptoms: ? A seizure. ? Shortness of breath or trouble breathing. ? Trouble speaking or slurred speech. ? Fainting. ? Chest pain. These symptoms may represent a serious problem that is an emergency. Do not wait to see if the symptoms will go away. Get medical help right away. Call your local emergency services (911 in the [place] Do not drive yourself to the hospital. Summary  Preeclampsia is a serious condition that may develop during pregnancy.  Diagnosing and treating preeclampsia early is very important.  Keep all prenatal and follow-up visits. This is important.  Get help right away if you have a seizure, shortness of breath or trouble breathing, trouble speaking or slurred speech, chest pain, or fainting. This information is not intended to replace advice given to you by your health care provider. Make sure you discuss any questions you have with your health care provider. Document Revised: 08/19/2020 Document Reviewed: 08/19/2020 Elsevier Patient Education  2021 [employer].

## 2021-05-06 NOTE — Telephone Encounter (Signed)
Pt states that she is concerned about atypical preeclampsia she saw that she has high protein in urine, pt experiencing swelling,heache, abd pain, lower back pain and change in vision. Pt is requesting a call back for recommendations for her concerns. Please Advise.

## 2021-05-06 NOTE — Progress Notes (Signed)
Pt states her headache is feeling better 3/10 on a 0-10 pain scale. She said she only notices the "black spot" floater when her head is hurting. She said she has had glasses since ninth grade and has never problems with her prescription, but over the last 2-3 days she states "it is blurry when I look down on the lower half of my glasses." Pt denies epigastric pain. CNM states she will be coming to evaluate pt at bedside after leaving clinic.

## 2021-05-06 NOTE — Progress Notes (Signed)
Orders placed for observation.    Serafina Royals, CNM [facility], [facility] 05/06/21 2:32 PM

## 2021-05-08 ENCOUNTER — Other Ambulatory Visit: Payer: Self-pay

## 2021-05-08 ENCOUNTER — Encounter: Payer: Self-pay | Admitting: Obstetrics and Gynecology

## 2021-05-08 ENCOUNTER — Observation Stay
Admission: EM | Admit: 2021-05-08 | Discharge: 2021-05-08 | Disposition: A | Discharge status: Home / Self Care | Payer: BC Managed Care – PPO | Attending: Obstetrics and Gynecology | Admitting: Obstetrics and Gynecology

## 2021-05-08 DIAGNOSIS — Z3A35 35 weeks gestation of pregnancy: Secondary | ICD-10-CM | POA: Insufficient documentation

## 2021-05-08 DIAGNOSIS — O4703 False labor before 37 completed weeks of gestation, third trimester: Principal | ICD-10-CM | POA: Insufficient documentation

## 2021-05-08 DIAGNOSIS — O99013 Anemia complicating pregnancy, third trimester: Secondary | ICD-10-CM | POA: Diagnosis not present

## 2021-05-08 DIAGNOSIS — D649 Anemia, unspecified: Secondary | ICD-10-CM | POA: Insufficient documentation

## 2021-05-08 DIAGNOSIS — O26893 Other specified pregnancy related conditions, third trimester: Secondary | ICD-10-CM | POA: Insufficient documentation

## 2021-05-08 DIAGNOSIS — H538 Other visual disturbances: Secondary | ICD-10-CM | POA: Insufficient documentation

## 2021-05-08 DIAGNOSIS — R2240 Localized swelling, mass and lump, unspecified lower limb: Secondary | ICD-10-CM | POA: Diagnosis not present

## 2021-05-08 LAB — COMPREHENSIVE METABOLIC PANEL
ALT: 16 U/L (ref 0–44)
AST: 19 U/L (ref 15–41)
Albumin: 3 g/dL — ABNORMAL LOW (ref 3.5–5.0)
Alkaline Phosphatase: 66 U/L (ref 38–126)
Anion gap: 8 (ref 5–15)
BUN: <5 mg/dL — ABNORMAL LOW (ref 6–20)
CO2: 23 mmol/L (ref 22–32)
Calcium: 8.7 mg/dL — ABNORMAL LOW (ref 8.9–10.3)
Chloride: 103 mmol/L (ref 98–111)
Creatinine, Ser: 0.53 mg/dL (ref 0.44–1.00)
GFR, Estimated: >60 mL/min (ref >60)
Glucose, Bld: 108 mg/dL — ABNORMAL HIGH (ref 70–99)
Potassium: 3.8 mmol/L (ref 3.5–5.1)
Sodium: 134 mmol/L — ABNORMAL LOW (ref 135–145)
Total Bilirubin: 0.5 mg/dL (ref 0.3–1.2)
Total Protein: 6.5 g/dL (ref 6.5–8.1)

## 2021-05-08 LAB — PROTEIN / CREATININE RATIO, URINE
Creatinine, Urine: 151 mg/dL
Protein Creatinine Ratio: 0.12 mg/mg{Cre} (ref 0.00–0.15)
Total Protein, Urine: 18 mg/dL

## 2021-05-08 LAB — CBC
HCT: 33.3 % — ABNORMAL LOW (ref 36.0–46.0)
Hemoglobin: 11.2 g/dL — ABNORMAL LOW (ref 12.0–15.0)
MCH: 27.4 pg (ref 26.0–34.0)
MCHC: 33.6 g/dL (ref 30.0–36.0)
MCV: 81.4 fL (ref 80.0–100.0)
Platelets: 358 10*3/uL (ref 150–400)
RBC: 4.09 MIL/uL (ref 3.87–5.11)
RDW: 13.1 % (ref 11.5–15.5)
WBC: 14.5 10*3/uL — ABNORMAL HIGH (ref 4.0–10.5)
nRBC: 0 % (ref 0.0–0.2)

## 2021-05-08 LAB — WET PREP, GENITAL
Clue Cells Wet Prep HPF POC: NONE SEEN
Sperm: NONE SEEN
Trich, Wet Prep: NONE SEEN
Yeast Wet Prep HPF POC: NONE SEEN

## 2021-05-08 LAB — GROUP B STREP BY PCR: Group B strep by PCR: NEGATIVE

## 2021-05-08 NOTE — Discharge Summary (Signed)
Obstetric Discharge Summary  Patient ID: Anne Clark MRN: [id number] DOB/AGE: 1986-05-14 35 y.o.   Date of Admission: 05/08/2021  Date of Discharge: 05/08/21  Admitting Diagnosis: Observation at [redacted]w[redacted]d  Secondary Diagnosis:   Patient Active Problem List   Diagnosis Date Noted  . Indication for care in labor and delivery, antepartum 05/06/2021  . Headache in pregnancy 05/06/2021  . Blurred vision 05/06/2021  . Leg swelling in pregnancy in third trimester 05/06/2021  . Anemia in pregnancy 03/24/2021  . 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 [id number] female at [redacted]w[redacted]d, EDD Estimated Date of Delivery: 06/09/21 who presented to triage for complaints of regular, mild contractions since 0500; patient traveled from [place], [place] by car for evalution.  She was evaluated by the nurses with no significant findings for preterm labor or fetal distress. Vital signs stable. An NST was performed and has been reviewed by CNM. She was treated with rest and oral hydration.   NST INTERPRETATION: Indications: rule out uterine contractions  Mode: External Baseline Rate (A): 125 bpm Variability: Moderate Accelerations: 15 x 15 Decelerations: None  Contraction Frequency (min): 4-6   Impression: reactive  Dilation: 1.5 Effacement (%): 70 Station: Ballotable Exam by:: J Lunsford RN   Plan: NST performed was reviewed and was found to be reactive. She was discharged home with bleeding/labor precautions.  Continue routine prenatal care. Follow up with CNM as previously scheduled.    Discharge Instructions: Per After Visit Summary.  Activity: Also refer to After Visit Summary.  Diet: Regular  Medications: Allergies as of 05/08/2021   No Known Allergies     Medication  List    TAKE these medications   ergocalciferol 1.25 MG (50000 UT) capsule Commonly known as: VITAMIN D2 Take 50,000 Units by mouth once a week.   famotidine 20 MG tablet Commonly known as: PEPCID Take 1 tablet (20 mg total) by mouth 2 (two) times daily.   Magnesium Oxide 400 MG Caps Take 1 capsule (400 mg total) by mouth 2 (two) times daily as needed (headache prevention).   prenatal multivitamin Tabs tablet Take 1 tablet by mouth daily at 12 noon.   valACYclovir 500 MG tablet Commonly known as: VALTREX Take 1 tablet (500 mg total) by mouth 2 (two) times daily.      Outpatient follow up:   Follow-up Information    Gunnar Bulla, CNM Follow up.   Specialties: Certified Nurse Midwife, Obstetrics and Gynecology, Radiology Why: Please keep appointment as scheduled Contact information: [address] [place] [address] [phone]               Discharged Condition: stable  Discharged to: home   Serafina Royals, CNM  [facility], [facility] 05/08/21 1:29 PM

## 2021-05-08 NOTE — Discharge Instructions (Signed)
Preterm Labor The normal length of a pregnancy is 39-41 weeks. Preterm labor is when labor starts before 37 completed weeks of pregnancy. Babies who are born prematurely and survive may not be fully developed and may be at an increased risk for long-term problems such as cerebral palsy, developmental delays, and vision and hearing problems. Babies who are born too early may have problems soon after birth. Problems may include regulating blood sugar, body temperature, heart rate, and breathing rate. These babies often have trouble with feeding. The risk of having problems is highest for babies who are born before 34 weeks of pregnancy. What are the causes? The exact cause of this condition is not known. What increases the risk? You are more likely to have preterm labor if you have certain risk factors that relate to your medical history, problems with present and past pregnancies, and lifestyle factors. Medical history  You have abnormalities of the uterus, including a short cervix.  You have STIs (sexually transmitted infections), or other infections of the urinary tract and the vagina.  You have chronic illnesses, such as blood clotting problems, diabetes, or high blood pressure.  You are overweight or underweight. Present and past pregnancies  You have had preterm labor before.  You are pregnant with twins or other multiples.  You have been diagnosed with a condition in which the placenta covers your cervix (placenta previa).  You waited less than 6 months between giving birth and becoming pregnant again.  Your unborn baby has some abnormalities.  You have vaginal bleeding during pregnancy.  You became pregnant through in vitro fertilization (IVF). Lifestyle and environmental factors  You use tobacco products.  You drink alcohol.  You use street drugs.  You have stress and no social support.  You experience domestic violence.  You are exposed to certain chemicals or  environmental pollutants. Other factors  You are younger than age 17 or older than age 35. What are the signs or symptoms? Symptoms of this condition include:  Cramps similar to those that can happen during a menstrual period. The cramps may happen with diarrhea.  Pain in the abdomen or lower back.  Regular contractions that may feel like tightening of the abdomen.  A feeling of increased pressure in the pelvis.  Increased watery or bloody mucus discharge from the vagina.  Water breaking (ruptured amniotic sac). How is this diagnosed? This condition is diagnosed based on:  Your medical history and a physical exam.  A pelvic exam.  An ultrasound.  Monitoring your uterus for contractions.  Other tests, including: ? A swab of the cervix to check for a chemical called fetal fibronectin. ? Urine tests. How is this treated? Treatment for this condition depends on the length of your pregnancy, your condition, and the health of your baby. Treatment may include:  Taking medicines, such as: ? Hormone medicines. These may be given early in pregnancy to help support the pregnancy. ? Medicines to stop contractions. ? Medicines to help mature the baby's lungs. These may be prescribed if the risk of delivery is high. ? Medicines to prevent your baby from developing cerebral palsy.  Bed rest. If the labor happens before 34 weeks of pregnancy, you may need to stay in the hospital.  Delivery of the baby. Follow these instructions at home:  Do not use any products that contain nicotine or tobacco, such as cigarettes, e-cigarettes, and chewing tobacco. If you need help quitting, ask your health care provider.  Do not drink alcohol.    Take over-the-counter and prescription medicines only as told by your health care provider.  Rest as told by your health care provider.  Return to your normal activities as told by your health care provider. Ask your health care provider what activities  are safe for you.  Keep all follow-up visits as told by your health care provider. This is important.   How is this prevented? To increase your chance of having a full-term pregnancy:  Do not use street drugs or medicines that have not been prescribed to you during your pregnancy.  Talk with your health care provider before taking any herbal supplements, even if you have been taking them regularly.  Make sure you gain a healthy amount of weight during your pregnancy.  Watch for infection. If you think that you might have an infection, get it checked right away. Symptoms of infection may include: ? Fever. ? Abnormal vaginal discharge or discharge that smells bad. ? Pain or burning with urination. ? Needing to urinate urgently. ? Frequently urinating or passing small amounts of urine frequently. ? Blood in your urine. ? Urine that smells bad or unusual.  Tell your health care provider if you have had preterm labor before. Contact a health care provider if:  You think you are going into preterm labor.  You have signs or symptoms of preterm labor.  You have symptoms of infection. Get help right away if:  You are having regular, painful contractions every 5 minutes or less.  Your water breaks. Summary  Preterm labor is labor that starts before you reach 37 weeks of pregnancy.  Delivering your baby early increases your baby's risk of developing lifelong problems.  The exact cause of preterm labor is unknown. However, having an abnormal uterus, an STI (sexually transmitted infection), or vaginal bleeding during pregnancy increases your risk for preterm labor.  Keep all follow-up visits as told by your health care provider. This is important.  Contact a health care provider if you have signs or symptoms of preterm labor. This information is not intended to replace advice given to you by your health care provider. Make sure you discuss any questions you have with your health care  provider. Document Revised: 12/30/2019 Document Reviewed: 12/30/2019 Elsevier Patient Education  2021 Elsevier Inc.  

## 2021-05-08 NOTE — OB Triage Note (Signed)
Pt presents for CTX since 0445 that occur every 6-7 minutes. Pt rates pain 2-3/10 in her lower abd. +FM. Denies LOF/bleeding.

## 2021-05-08 NOTE — Progress Notes (Signed)
Pt given discharge instructionss including labor precautions and follow up care.

## 2021-05-12 ENCOUNTER — Encounter: Payer: Self-pay | Admitting: Certified Nurse Midwife

## 2021-05-12 ENCOUNTER — Other Ambulatory Visit: Payer: Self-pay

## 2021-05-12 ENCOUNTER — Ambulatory Visit (INDEPENDENT_AMBULATORY_CARE_PROVIDER_SITE_OTHER): Payer: BC Managed Care – PPO | Admitting: Certified Nurse Midwife

## 2021-05-12 VITALS — BP 124/85 | HR 105 | Wt 266.7 lb

## 2021-05-12 DIAGNOSIS — Z789 Other specified health status: Secondary | ICD-10-CM

## 2021-05-12 DIAGNOSIS — Z3403 Encounter for supervision of normal first pregnancy, third trimester: Secondary | ICD-10-CM

## 2021-05-12 DIAGNOSIS — Z113 Encounter for screening for infections with a predominantly sexual mode of transmission: Secondary | ICD-10-CM

## 2021-05-12 DIAGNOSIS — Z3483 Encounter for supervision of other normal pregnancy, third trimester: Secondary | ICD-10-CM

## 2021-05-12 DIAGNOSIS — Z3A36 36 weeks gestation of pregnancy: Secondary | ICD-10-CM

## 2021-05-12 LAB — POCT URINALYSIS DIPSTICK OB
Bilirubin, UA: NEGATIVE
Blood, UA: NEGATIVE
Glucose, UA: NEGATIVE
Ketones, UA: NEGATIVE
Leukocytes, UA: NEGATIVE
Nitrite, UA: NEGATIVE
POC,PROTEIN,UA: NEGATIVE
Spec Grav, UA: 1.01 (ref 1.010–1.025)
Urobilinogen, UA: 0.2 E.U./dL
pH, UA: 6.5 (ref 5.0–8.0)

## 2021-05-12 NOTE — Patient Instructions (Signed)
Fetal Movement Counts Patient Name: ________________________________________________ Patient Due Date: ____________________  What is a fetal movement count? A fetal movement count is the number of times that you feel your baby move during a certain amount of time. This may also be called a fetal kick count. A fetal movement count is recommended for every pregnant woman. You may be asked to start counting fetal movements as early as week 28 of your pregnancy. Pay attention to when your baby is most active. You may notice your baby's sleep and wake cycles. You may also notice things that make your baby move more. You should do a fetal movement count:  When your baby is normally most active.  At the same time each day. A good time to count movements is while you are resting, after having something to eat and drink. How do I count fetal movements? 1. Find a quiet, comfortable area. Sit, or lie down on your side. 2. Write down the date, the start time and stop time, and the number of movements that you felt between those two times. Take this information with you to your health care visits. 3. Write down your start time when you feel the first movement. 4. Count kicks, flutters, swishes, rolls, and jabs. You should feel at least 10 movements. 5. You may stop counting after you have felt 10 movements, or if you have been counting for 2 hours. Write down the stop time. 6. If you do not feel 10 movements in 2 hours, contact your health care provider for further instructions. Your health care provider may want to do additional tests to assess your baby's well-being. Contact a health care provider if:  You feel fewer than 10 movements in 2 hours.  Your baby is not moving like he or she usually does. Date: ____________ Start time: ____________ Stop time: ____________ Movements: ____________ Date: ____________ Start time: ____________ Stop time: ____________ Movements: ____________ Date: ____________  Start time: ____________ Stop time: ____________ Movements: ____________ Date: ____________ Start time: ____________ Stop time: ____________ Movements: ____________ Date: ____________ Start time: ____________ Stop time: ____________ Movements: ____________ Date: ____________ Start time: ____________ Stop time: ____________ Movements: ____________ Date: ____________ Start time: ____________ Stop time: ____________ Movements: ____________ Date: ____________ Start time: ____________ Stop time: ____________ Movements: ____________ Date: ____________ Start time: ____________ Stop time: ____________ Movements: ____________ This information is not intended to replace advice given to you by your health care provider. Make sure you discuss any questions you have with your health care provider. Document Revised: 07/17/2019 Document Reviewed: 07/17/2019 Elsevier Patient Education  2021 Elsevier Inc.  

## 2021-05-12 NOTE — Progress Notes (Signed)
ROB-Reports loss of mucus plug, doing well otherwise. GC/Ch collected, see orders. B-12 today due to pescatarian diet and leg swelling. Request SVE, unchanged since triage visit. Anticipatory guidance regarding course of prenatal care. Reviewed red flag symptoms and when to call. RTC x 1 week for ROB or sooner if needed.

## 2021-05-12 NOTE — Progress Notes (Signed)
ROB: She thinks she has lost her mucous plug.

## 2021-05-13 LAB — VITAMIN B12: Vitamin B-12: 240 pg/mL (ref 232–1245)

## 2021-05-16 LAB — GC/CHLAMYDIA PROBE AMP
Chlamydia trachomatis, NAA: NEGATIVE
Neisseria Gonorrhoeae by PCR: NEGATIVE

## 2021-05-18 ENCOUNTER — Encounter: Payer: Self-pay | Admitting: Obstetrics and Gynecology

## 2021-05-18 ENCOUNTER — Inpatient Hospital Stay
Admit: 2021-05-18 | Discharge: 2021-05-19 | DRG: 807 | Disposition: A | Discharge status: Home / Self Care | Payer: BC Managed Care – PPO | Attending: Certified Nurse Midwife | Admitting: Certified Nurse Midwife

## 2021-05-18 ENCOUNTER — Other Ambulatory Visit: Payer: Self-pay

## 2021-05-18 DIAGNOSIS — O99213 Obesity complicating pregnancy, third trimester: Secondary | ICD-10-CM | POA: Diagnosis not present

## 2021-05-18 DIAGNOSIS — Z3A37 37 weeks gestation of pregnancy: Secondary | ICD-10-CM

## 2021-05-18 DIAGNOSIS — O99214 Obesity complicating childbirth: Principal | ICD-10-CM | POA: Diagnosis present

## 2021-05-18 DIAGNOSIS — Z20822 Contact with and (suspected) exposure to covid-19: Secondary | ICD-10-CM | POA: Diagnosis present

## 2021-05-18 DIAGNOSIS — O99013 Anemia complicating pregnancy, third trimester: Secondary | ICD-10-CM

## 2021-05-18 DIAGNOSIS — O26893 Other specified pregnancy related conditions, third trimester: Secondary | ICD-10-CM | POA: Diagnosis present

## 2021-05-18 DIAGNOSIS — O09293 Supervision of pregnancy with other poor reproductive or obstetric history, third trimester: Secondary | ICD-10-CM | POA: Diagnosis not present

## 2021-05-18 HISTORY — DX: Anemia, unspecified: D64.9

## 2021-05-18 LAB — CBC WITH DIFFERENTIAL/PLATELET
Abs Immature Granulocytes: 0.04 10*3/uL (ref 0.00–0.07)
Basophils Absolute: 0 10*3/uL (ref 0.0–0.1)
Basophils Relative: 0 %
Eosinophils Absolute: 0.1 10*3/uL (ref 0.0–0.5)
Eosinophils Relative: 0 %
HCT: 34.6 % — ABNORMAL LOW (ref 36.0–46.0)
Hemoglobin: 12 g/dL (ref 12.0–15.0)
Immature Granulocytes: 0 %
Lymphocytes Relative: 16 %
Lymphs Abs: 2.2 10*3/uL (ref 0.7–4.0)
MCH: 27.6 pg (ref 26.0–34.0)
MCHC: 34.7 g/dL (ref 30.0–36.0)
MCV: 79.7 fL — ABNORMAL LOW (ref 80.0–100.0)
Monocytes Absolute: 0.9 10*3/uL (ref 0.1–1.0)
Monocytes Relative: 6 %
Neutro Abs: 10.9 10*3/uL — ABNORMAL HIGH (ref 1.7–7.7)
Neutrophils Relative %: 78 %
Platelets: 344 10*3/uL (ref 150–400)
RBC: 4.34 MIL/uL (ref 3.87–5.11)
RDW: 13.1 % (ref 11.5–15.5)
WBC: 14.1 10*3/uL — ABNORMAL HIGH (ref 4.0–10.5)
nRBC: 0 % (ref 0.0–0.2)

## 2021-05-18 LAB — ABO/RH: ABO/RH(D): A POS

## 2021-05-18 LAB — RESP PANEL BY RT-PCR (FLU A&B, COVID) ARPGX2
Influenza A by PCR: NEGATIVE
Influenza B by PCR: NEGATIVE
SARS Coronavirus 2 by RT PCR: NEGATIVE

## 2021-05-18 LAB — TYPE AND SCREEN
ABO/RH(D): A POS
Antibody Screen: NEGATIVE

## 2021-05-18 MED ORDER — COCONUT OIL OIL
1.0000 "application " | TOPICAL_OIL | Status: DC | PRN
  Filled 2021-05-18: qty 120

## 2021-05-18 MED ORDER — METHYLERGONOVINE MALEATE 0.2 MG PO TABS
0.2000 mg | ORAL_TABLET | ORAL | Status: DC | PRN
  Filled 2021-05-18: qty 1

## 2021-05-18 MED ORDER — WITCH HAZEL-GLYCERIN EX PADS: 1.0000 "application " | MEDICATED_PAD | CUTANEOUS | Status: DC | PRN

## 2021-05-18 MED ORDER — SIMETHICONE 80 MG PO CHEW: 80.0000 mg | CHEWABLE_TABLET | ORAL | Status: DC | PRN

## 2021-05-18 MED ORDER — ACETAMINOPHEN 325 MG PO TABS: 650.0000 mg | ORAL_TABLET | ORAL | Status: DC | PRN

## 2021-05-18 MED ORDER — MISOPROSTOL 200 MCG PO TABS
ORAL_TABLET | ORAL | Status: AC
  Filled 2021-05-18: qty 4

## 2021-05-18 MED ORDER — LIDOCAINE HCL (PF) 1 % IJ SOLN: 30.0000 mL | INTRAMUSCULAR | Status: DC | PRN

## 2021-05-18 MED ORDER — LACTATED RINGERS IV SOLN: 500.0000 mL | INTRAVENOUS | Status: DC | PRN

## 2021-05-18 MED ORDER — SOD CITRATE-CITRIC ACID 500-334 MG/5ML PO SOLN: 30.0000 mL | ORAL | Status: DC | PRN

## 2021-05-18 MED ORDER — METHYLERGONOVINE MALEATE 0.2 MG/ML IJ SOLN: 0.2000 mg | INTRAMUSCULAR | Status: DC | PRN

## 2021-05-18 MED ORDER — DIBUCAINE (PERIANAL) 1 % EX OINT: 1.0000 "application " | TOPICAL_OINTMENT | CUTANEOUS | Status: DC | PRN

## 2021-05-18 MED ORDER — ONDANSETRON HCL 4 MG/2ML IJ SOLN: 4.0000 mg | Freq: Four times a day (QID) | INTRAMUSCULAR | Status: DC | PRN

## 2021-05-18 MED ORDER — SENNOSIDES-DOCUSATE SODIUM 8.6-50 MG PO TABS: 2.0000 | ORAL_TABLET | ORAL | Status: DC

## 2021-05-18 MED ORDER — IBUPROFEN 600 MG PO TABS
600.0000 mg | ORAL_TABLET | Freq: Four times a day (QID) | ORAL | Status: DC
  Administered 2021-05-18 – 2021-05-19 (×4): 600 mg via ORAL
  Filled 2021-05-18 (×4): qty 1

## 2021-05-18 MED ORDER — BENZOCAINE-MENTHOL 20-0.5 % EX AERO
1.0000 "application " | INHALATION_SPRAY | CUTANEOUS | Status: DC | PRN
  Filled 2021-05-18: qty 56

## 2021-05-18 MED ORDER — PRENATAL MULTIVITAMIN CH
1.0000 | ORAL_TABLET | Freq: Every day | ORAL | Status: DC
  Filled 2021-05-18: qty 1

## 2021-05-18 MED ORDER — ACETAMINOPHEN 500 MG PO TABS
1000.0000 mg | ORAL_TABLET | Freq: Four times a day (QID) | ORAL | Status: DC | PRN
  Administered 2021-05-18: 1000 mg via ORAL
  Filled 2021-05-18: qty 2

## 2021-05-18 MED ORDER — OXYTOCIN-SODIUM CHLORIDE 30-0.9 UT/500ML-% IV SOLN
INTRAVENOUS | Status: AC
  Filled 2021-05-18: qty 500

## 2021-05-18 MED ORDER — AMMONIA AROMATIC IN INHA
RESPIRATORY_TRACT | Status: AC
  Filled 2021-05-18: qty 10

## 2021-05-18 MED ORDER — DIPHENHYDRAMINE HCL 25 MG PO CAPS: 25.0000 mg | ORAL_CAPSULE | Freq: Four times a day (QID) | ORAL | Status: DC | PRN

## 2021-05-18 MED ORDER — LIDOCAINE HCL (PF) 1 % IJ SOLN
INTRAMUSCULAR | Status: AC
  Filled 2021-05-18: qty 30

## 2021-05-18 MED ORDER — ONDANSETRON HCL 4 MG PO TABS
4.0000 mg | ORAL_TABLET | ORAL | Status: DC | PRN
  Filled 2021-05-18: qty 1

## 2021-05-18 MED ORDER — OXYTOCIN 10 UNIT/ML IJ SOLN
INTRAMUSCULAR | Status: AC
  Filled 2021-05-18: qty 2

## 2021-05-18 MED ORDER — ONDANSETRON HCL 4 MG/2ML IJ SOLN: 4.0000 mg | INTRAMUSCULAR | Status: DC | PRN

## 2021-05-18 NOTE — OB Triage Note (Signed)
Pt presented to L/D triage with reported contractions that began at 0500 today and have increased in intensity/frequency. Pt rated pain 6/10, unrelieved by rest or ambulation. Pt reports no bleeding or LOF and positive fetal movement. Monitors applied/assessing. FHT 135. VSS.

## 2021-05-18 NOTE — H&P (Signed)
Obstetric History and Physical  Anne Clark is a 35 y.o. [id number] with IUP at [redacted]w[redacted]d presenting with regular painful contractions since earlier this morning.   Patient states she has been having  regular, every two (2) to three (3) minutes contractions, minimal vaginal bleeding, intact membranes, with active fetal movement.    Denies difficulty breathing or respiratory distress, chest pain, dysuria and leg pain.  Prenatal Course  Source of Care: [facility]Transfer from [facility] at 17 weeks, total visits: 8  Pregnancy complications or risks: Late entry to care, Obesity in pregnancy, History of miscarriage  Prenatal labs and studies:  ABO, Rh: --/--/A POS (06/08 1142)  Antibody: NEG (06/08 1142)  Varicella: 967 (01/20 1020)  Rubella: 2.28 (01/20 1020)  RPR: Non Reactive (04/13 1447)   HBsAg: Negative (01/20 1020)   HIV: Non Reactive (01/20 1020)   CHE:NIDPOEUM/-- (05/29 0942)  1 hr Glucola: 106 (04/13 1447)  Genetic screening: Declined  Anatomy US: Complete, normal (02/08 1445)  Past Medical History:  Diagnosis Date  . Anemia     Past Surgical History:  Procedure Laterality Date  . DILATION AND CURETTAGE OF UTERUS    . DILATION AND EVACUATION  05/20/2012   missed ab    OB History  Gravida Para Term Preterm AB Living  5 2     2 2   SAB IAB Ectopic Multiple Live Births  2       2    # Outcome Date GA Lbr Len/2nd Weight Sex Delivery Anes PTL Lv  5 Current           4 Para 03/12/19 [redacted]w[redacted]d  2948 g F  None N LIV  3 Para 04/02/17 [redacted]w[redacted]d  3232 g F Vag-Spont Other N LIV  2 SAB 2015 [redacted]w[redacted]d         1 SAB 2013 [redacted]w[redacted]d            Complications: History of D&C    Social History   Socioeconomic History  . Marital status: Married    Spouse name: Abijah  . Number of children: Not on file  . Years of education: Not on file  . Highest education level: Not on file  Occupational History  . Not on file  Tobacco Use  . Smoking status: Never Smoker  . Smokeless  tobacco: Never Used  Vaping Use  . Vaping Use: Never used  Substance and Sexual Activity  . Alcohol use: Not Currently    Alcohol/week: 1.0 standard drink    Types: 1 Standard drinks or equivalent per week  . Drug use: No  . Sexual activity: Yes    Partners: Male    Birth control/protection: None, Surgical    Comment: planning  Other Topics Concern  . Not on file  Social History Narrative  . Not on file   Social Determinants of Health   Financial Resource Strain: Not on file  Food Insecurity: Not on file  Transportation Needs: Not on file  Physical Activity: Not on file  Stress: Not on file  Social Connections: Not on file    Family History  Problem Relation Age of Onset  . Diabetes Father   . Hypertension Father   . Breast cancer Paternal Aunt     Medications Prior to Admission  Medication Sig Dispense Refill Last Dose  . ergocalciferol (VITAMIN D2) 50000 UNITS capsule Take 50,000 Units by mouth once a week.   Past Month at Unknown time  . famotidine (PEPCID) 20 MG tablet Take 1  tablet (20 mg total) by mouth 2 (two) times daily. 60 tablet 3 Past Month at Unknown time  . Prenatal Vit-Fe Fumarate-FA (PRENATAL MULTIVITAMIN) TABS tablet Take 1 tablet by mouth daily at 12 noon.   05/17/2021 at Unknown time  . valACYclovir (VALTREX) 500 MG tablet Take 1 tablet (500 mg total) by mouth 2 (two) times daily. 30 tablet 6 05/17/2021 at Unknown time  . Magnesium Oxide 400 MG CAPS Take 1 capsule (400 mg total) by mouth 2 (two) times daily as needed (headache prevention). (Patient not taking: Reported on 05/18/2021) 60 capsule 1 Completed Course at Unknown time    No Known Allergies  Review of Systems: Negative except for what is mentioned in HPI.  Physical Exam:  Temp:  [97.9 F (36.6 C)] 97.9 F (36.6 C) (06/08 1102) Pulse Rate:  [81] 81 (06/08 1300) Resp:  [18] 18 (06/08 1300) BP: (108-109)/(68-71) 108/68 (06/08 1300) Weight:  [120.2 kg] 120.2 kg (06/08 1058)  GENERAL:  Well-developed, well-nourished female in no acute distress.   LUNGS: Clear to auscultation bilaterally.   HEART: Regular rate and rhythm.  ABDOMEN: Soft, nontender, nondistended, gravid.  EXTREMITIES: Nontender, no edema, 2+ distal pulses.  Cervical Exam: Dilation: 4 Effacement (%): 70 Cervical Position: Middle Station: -2 Presentation: Vertex Exam by:: Christean Leaf RN  FHR Category I  Contractions: Regular, every few minutes with soft resting tone   Pertinent Labs/Studies:   Results for orders placed or performed during the hospital encounter of 05/18/21 (from the past 24 hour(s))  Resp Panel by RT-PCR (Flu A&B, Covid) Nasopharyngeal Swab     Status: None   Collection Time: 05/18/21 11:24 AM   Specimen: Nasopharyngeal Swab; Nasopharyngeal(NP) swabs in vial transport medium  Result Value Ref Range   SARS Coronavirus 2 by RT PCR NEGATIVE NEGATIVE   Influenza A by PCR NEGATIVE NEGATIVE   Influenza B by PCR NEGATIVE NEGATIVE  CBC with Differential/Platelet     Status: Abnormal   Collection Time: 05/18/21 11:42 AM  Result Value Ref Range   WBC 14.1 (H) 4.0 - 10.5 K/uL   RBC 4.34 3.87 - 5.11 MIL/uL   Hemoglobin 12.0 12.0 - 15.0 g/dL   HCT 70.1 (L) 77.9 - 39.0 %   MCV 79.7 (L) 80.0 - 100.0 fL   MCH 27.6 26.0 - 34.0 pg   MCHC 34.7 30.0 - 36.0 g/dL   RDW 30.0 92.3 - 30.0 %   Platelets 344 150 - 400 K/uL   nRBC 0.0 0.0 - 0.2 %   Neutrophils Relative % 78 %   Neutro Abs 10.9 (H) 1.7 - 7.7 K/uL   Lymphocytes Relative 16 %   Lymphs Abs 2.2 0.7 - 4.0 K/uL   Monocytes Relative 6 %   Monocytes Absolute 0.9 0.1 - 1.0 K/uL   Eosinophils Relative 0 %   Eosinophils Absolute 0.1 0.0 - 0.5 K/uL   Basophils Relative 0 %   Basophils Absolute 0.0 0.0 - 0.1 K/uL   Immature Granulocytes 0 %   Abs Immature Granulocytes 0.04 0.00 - 0.07 K/uL  Type and screen [facility]     Status: None   Collection Time: 05/18/21 11:42 AM  Result Value Ref Range   ABO/RH(D) A POS     Antibody Screen NEG    Sample Expiration      05/21/2021,2359 Performed at [facility], [address] [place], [place] [address]     Assessment :  Anne Clark is a 34 y.o. [id number] at  [redacted]w[redacted]d being admitted for labor, Rh positive, GBS negative   FHR Category I  Plan:  Admit to birthing suites, see orders.   Labor: Expectant management.    Plan: Hopeful for vaginal birth in tub.  Dr. Logan Bores notified of admission and plan of care.    Gunnar Bulla, CNM [facility], [facility] 05/18/21 1:26 PM

## 2021-05-18 NOTE — Progress Notes (Addendum)
Pt laboring on toilet- tolerating well at this time. Intermittent fetal monitoring per CNM. CNM in department.  Pt planning waterbirth-consents signed.

## 2021-05-19 ENCOUNTER — Encounter: Payer: BC Managed Care – PPO | Admitting: Certified Nurse Midwife

## 2021-05-19 LAB — RPR: RPR Ser Ql: NONREACTIVE

## 2021-05-19 LAB — CBC
HCT: 32.6 % — ABNORMAL LOW (ref 36.0–46.0)
Hemoglobin: 10.9 g/dL — ABNORMAL LOW (ref 12.0–15.0)
MCH: 27 pg (ref 26.0–34.0)
MCHC: 33.4 g/dL (ref 30.0–36.0)
MCV: 80.9 fL (ref 80.0–100.0)
Platelets: 321 10*3/uL (ref 150–400)
RBC: 4.03 MIL/uL (ref 3.87–5.11)
RDW: 13.2 % (ref 11.5–15.5)
WBC: 14.2 10*3/uL — ABNORMAL HIGH (ref 4.0–10.5)
nRBC: 0 % (ref 0.0–0.2)

## 2021-05-19 MED ORDER — WITCH HAZEL-GLYCERIN EX PADS: 1.0000 "application " | MEDICATED_PAD | CUTANEOUS | 12 refills | Status: DC | PRN

## 2021-05-19 MED ORDER — BENZOCAINE-MENTHOL 20-0.5 % EX AERO: 1.0000 "application " | INHALATION_SPRAY | CUTANEOUS | 0 refills | Status: DC | PRN

## 2021-05-19 MED ORDER — SENNOSIDES-DOCUSATE SODIUM 8.6-50 MG PO TABS
2.0000 | ORAL_TABLET | ORAL | Status: DC
  Administered 2021-05-19: 2 via ORAL
  Filled 2021-05-19: qty 2

## 2021-05-19 MED ORDER — ACETAMINOPHEN 500 MG PO TABS: 1000.0000 mg | ORAL_TABLET | Freq: Four times a day (QID) | ORAL | 0 refills | Status: DC | PRN

## 2021-05-19 MED ORDER — IBUPROFEN 600 MG PO TABS: 600.0000 mg | ORAL_TABLET | Freq: Four times a day (QID) | ORAL | 0 refills | Status: DC

## 2021-05-19 NOTE — Progress Notes (Signed)
Pt discharged with infant. Discharge instructions, prescriptions, and follow up appointments given to and reviewed with patient. Pt verbalized understanding. Escorted out by auxillary.  

## 2021-05-19 NOTE — Discharge Summary (Signed)
Physician Obstetric Discharge Summary  Patient ID: Anne Clark MRN: [id number] DOB/AGE: May 25, 1986 35 y.o.   Date of Admission: 05/18/2021  Date of Discharge:   Admitting Diagnosis: Onset of Labor at [redacted]w[redacted]d  Secondary Diagnosis:   Patient Active Problem List   Diagnosis Date Noted   Normal labor 05/18/2021   Vaginal delivery    [redacted] weeks gestation of pregnancy    Indication for care in labor and delivery, antepartum 05/06/2021   Headache in pregnancy 05/06/2021   Blurred vision 05/06/2021   Leg swelling in pregnancy in third trimester 05/06/2021   Anemia in pregnancy 03/24/2021   Low grade squamous intraepithelial lesion (LGSIL) on cervical Pap smear 08/19/2014     Mode of Delivery: normal spontaneous vaginal delivery     Discharge Diagnosis: No other diagnosis   Intrapartum Procedures:  None   Post partum procedures: None  Complications: First degree perineal laceration, unrepaired   Brief Hospital Course   Anne Clark is a [id number] who had a SVD on 05/18/2021;  for further detaild, please refer to the delivey summary.  Patient had an uncomplicated postpartum course.  By time of discharge on PPD#1, her pain was controlled on oral pain medications; she had appropriate lochia and was ambulating, voiding without difficulty and tolerating regular diet.  She was deemed stable for discharge to home.     Labs: CBC Latest Ref Rng & Units 05/19/2021 05/18/2021 05/08/2021  WBC 4.0 - 10.5 K/uL 14.2(H) 14.1(H) 14.5(H)  Hemoglobin 12.0 - 15.0 g/dL 10.9(L) 12.0 11.2(L)  Hematocrit 36.0 - 46.0 % 32.6(L) 34.6(L) 33.3(L)  Platelets 150 - 400 K/uL 321 344 358   A POS Performed at [facility], [address] [place], [place] [address]   Physical exam:   Temp:  [97.8 F (36.6 C)-98.8 F (37.1 C)] 98 F (36.7 C) (06/09 0743) Pulse Rate:  [58-113] 62 (06/09 0743) Resp:  [18-20] 20 (06/09 0743) BP: (107-140)/(52-86) 107/73 (06/09 0743) SpO2:  [98 %-100 %] 100 %  (06/09 0743)  General: alert and no distress  Lochia: appropriate  Abdomen: soft, NT  Uterine Fundus: firm  Perineum: healing well, no significant drainage, no dehiscence, no significant erythema  Extremities: No evidence of DVT seen on physical exam. No lower extremity edema  Edinburgh Postnatal Depression Scale Screening Tool 05/19/2021  I have been able to laugh and see the funny side of things. 0  I have looked forward with enjoyment to things. 0  I have blamed myself unnecessarily when things went wrong. 0  I have been anxious or worried for no good reason. 0  I have felt scared or panicky for no good reason. 0  Things have been getting on top of me. 0  I have been so unhappy that I have had difficulty sleeping. 0  I have felt sad or miserable. 1  I have been so unhappy that I have been crying. 0  The thought of harming myself has occurred to me. 0  Edinburgh Postnatal Depression Scale Total 1      Discharge Instructions: Per After Visit Summary.  Activity: Advance as tolerated. Pelvic rest for 6 weeks.  Also refer to After Visit Summary  Diet: Regular  Medications: Allergies as of 05/19/2021   No Known Allergies      Medication List     STOP taking these medications    famotidine 20 MG tablet Commonly known as: PEPCID   Magnesium Oxide 400 MG Caps   valACYclovir 500 MG tablet Commonly  known as: VALTREX       TAKE these medications    acetaminophen 500 MG tablet Commonly known as: TYLENOL Take 2 tablets (1,000 mg total) by mouth every 6 (six) hours as needed for moderate pain (for pain scale < 4).   benzocaine-Menthol 20-0.5 % Aero Commonly known as: DERMOPLAST Apply 1 application topically as needed for irritation (perineal discomfort).   ergocalciferol 1.25 MG (50000 UT) capsule Commonly known as: VITAMIN D2 Take 50,000 Units by mouth once a week.   ibuprofen 600 MG tablet Commonly known as: ADVIL Take 1 tablet (600 mg total) by mouth every  6 (six) hours.   prenatal multivitamin Tabs tablet Take 1 tablet by mouth daily at 12 noon.   witch hazel-glycerin pad Commonly known as: TUCKS Apply 1 application topically as needed for hemorrhoids.               Discharge Care Instructions  (From admission, onward)           Start     Ordered   05/19/21 0000  Discharge wound care:       Comments: See AVS   05/19/21 1151           Outpatient follow up:   Follow-up Information     Gunnar Bulla, CNM Follow up.   Specialties: Certified Nurse Midwife, Obstetrics and Gynecology, Radiology Why: Someone from the office will call you to schedule a two (2) week TELEVISIT and six (6) week POSTPARTUM VISIT with JML. Contact information: [address] [place] [address] [phone]                Postpartum contraception: vasectomy  Discharged Condition: stable  Discharged to: home   Newborn Data: Disposition:home with mother  Apgars: APGAR (1 MIN): 8   APGAR (5 MINS): 9   APGAR (10 MINS):    Baby Feeding: Breast  Serafina Royals, CNM  [facility], [facility] 05/19/21 11:52 AM

## 2021-05-19 NOTE — Lactation Note (Signed)
This note was copied from a baby's chart. Lactation Consultation Note  Patient Name: Anne Clark Date: 05/19/2021 Reason for consult: Initial assessment;Early term 37-38.6wks Age:36 hours  Initial lactation visit. This is mom's 3rd baby, delivered via water birth 19 hours ago. BF experience with other 2 children; 2 years with last child.  No concerns voiced, no questions. LC briefly reviewed newborn feeding patterns, behaviors, attempts and successful feedings.  Encouraged feeding on demand/with cues, tracking output, keeping baby awake at the breast, and seek BF support as needed.  Whiteboard updated with [facility] name and number. Parents desire discharge this afternoon post 24hr testing. LC recommended to RN re-weighing newborn, as weight reflected 8hrs after birth was -6%, LC to be updated.  Maternal Data Has patient been taught Hand Expression?: Yes Does the patient have breastfeeding experience prior to this delivery?: Yes How long did the patient breastfeed?: 2 years (with last child; stopped 12/2020)  Feeding Mother's Current Feeding Choice: Breast Milk  LATCH Score                    Lactation Tools Discussed/Used    Interventions Interventions: Breast feeding basics reviewed  Discharge Discharge Education: Outpatient recommendation  Consult Status Consult Status: Complete    Danford Bad 05/19/2021, 10:05 AM

## 2021-05-25 ENCOUNTER — Telehealth: Payer: Self-pay | Admitting: Certified Nurse Midwife

## 2021-05-25 NOTE — Telephone Encounter (Signed)
Patient called about having abdominal pain. She delivered on 06/08. She does not have any fever, no vaginal bleeding and pain is controlled with Tylenol. She is taking Tylenol every 6 hours to control the pain and she has relief. I suggested that it sounds like normal pain after delivery and she thought it was normal as well. She just did not have any pain after her previous deliveries. I also informed patient is she gets any fever greater than 100.5, excessive bleeding to proceed to ED or UC. Please advise.

## 2021-05-25 NOTE — Telephone Encounter (Signed)
Patient called stating that she is concerned with the amount of pain she is experiencing, pt delivered on 06-08, states she has had 3 kids and never been in this much pain after. Pt is currently attempting to treat with tylenol and ibuprofen given by hospital. Please Advise.

## 2021-06-02 ENCOUNTER — Other Ambulatory Visit: Payer: Self-pay

## 2021-06-02 ENCOUNTER — Encounter: Payer: Self-pay | Admitting: Certified Nurse Midwife

## 2021-06-02 ENCOUNTER — Ambulatory Visit (INDEPENDENT_AMBULATORY_CARE_PROVIDER_SITE_OTHER): Payer: BC Managed Care – PPO | Admitting: Certified Nurse Midwife

## 2021-06-02 DIAGNOSIS — B370 Candidal stomatitis: Secondary | ICD-10-CM

## 2021-06-02 DIAGNOSIS — O9883 Other maternal infectious and parasitic diseases complicating the puerperium: Secondary | ICD-10-CM

## 2021-06-02 DIAGNOSIS — Z1331 Encounter for screening for depression: Secondary | ICD-10-CM

## 2021-06-02 DIAGNOSIS — O9103 Infection of nipple associated with lactation: Secondary | ICD-10-CM

## 2021-06-02 MED ORDER — FLUCONAZOLE 200 MG PO TABS: 200.0000 mg | ORAL_TABLET | Freq: Every day | ORAL | 1 refills | Status: DC

## 2021-06-02 NOTE — Progress Notes (Signed)
Virtual Visit via Telephone Note  I connected with Anne Clark on 06/02/21 at  4:00 PM EDT by telephone and verified that I am speaking with the correct person using two identifiers.  Location:  Patient: Anne Clark (home)  Provider: Serafina Royals, CNM ([facility], [facility])   I discussed the limitations, risks, security and privacy concerns of performing an evaluation and management service by telephone and the availability of in person appointments. I also discussed with the patient that there may be a patient responsible charge related to this service. The patient expressed understanding and agreed to proceed.   History of Present Illness:  Patient two (2) week postpartum after spontaneous vaginal birth at term in waterbirth tub.   Doing well over all.   Bleeding in light.   Breastfeeding is going well; however, infant just diagnosed with thrush. Questions need for maternal treatment..   Plans vasectomy for contraception. No other questions or concerns.    Observations/Objective:  Depression screen [facility] 2/9 06/02/2021 03/23/2021  Decreased Interest 0 0  Down, Depressed, Hopeless 0 0  PHQ - 2 Score 0 0  Altered sleeping 0 -  Tired, decreased energy 0 -  Change in appetite 0 -  Feeling bad or failure about yourself  0 -  Trouble concentrating 0 -  Moving slowly or fidgety/restless 0 -  Suicidal thoughts 0 -  PHQ-9 Score 0 -   GAD 7 : Generalized Anxiety Score 06/02/2021  Nervous, Anxious, on Edge 0  Control/stop worrying 0  Worry too much - different things 0  Trouble relaxing 0  Restless 0  Easily annoyed or irritable 0  Afraid - awful might happen 0  Total GAD 7 Score 0     Assessment:  Postpartum care and examination  Lactating mother  Thrush/Infection in Nipple due to Lactation  Negative depression screening   Plan:  Routine postpartum education.   Rx Diflucan, see orders.   Reviewed red flag symptoms and when to call.  RTC for  PPV scheduled on 07/04/2021 at 1600 or sooner if needed.  Follow Up Instructions:    I discussed the assessment and treatment plan with the patient. The patient was provided an opportunity to ask questions and all were answered. The patient agreed with the plan and demonstrated an understanding of the instructions.   The patient was advised to call back or seek an in-person evaluation if the symptoms worsen or if the condition fails to improve as anticipated.  I provided 6 minutes of non-face-to-face time during this encounter.   Serafina Royals, CNM [facility], [facility] 06/03/21 1:42 PM

## 2021-07-04 ENCOUNTER — Encounter: Payer: BC Managed Care – PPO | Admitting: Certified Nurse Midwife

## 2021-07-07 ENCOUNTER — Encounter: Payer: BC Managed Care – PPO | Admitting: Certified Nurse Midwife

## 2021-07-27 DIAGNOSIS — Z8616 Personal history of COVID-19: Secondary | ICD-10-CM

## 2021-07-27 HISTORY — DX: Personal history of COVID-19: Z86.16

## 2021-07-28 ENCOUNTER — Telehealth: Payer: Self-pay

## 2021-07-28 NOTE — Telephone Encounter (Signed)
Pt called no answer LM via VM to contact the office to schedule an appointment with Dr. Valentino Saxon at 2pm for a video visit.

## 2021-07-29 ENCOUNTER — Other Ambulatory Visit: Payer: Self-pay

## 2021-07-29 ENCOUNTER — Ambulatory Visit (INDEPENDENT_AMBULATORY_CARE_PROVIDER_SITE_OTHER): Payer: BC Managed Care – PPO | Admitting: Obstetrics and Gynecology

## 2021-07-29 VITALS — Ht 69.0 in

## 2021-07-29 DIAGNOSIS — F418 Other specified anxiety disorders: Secondary | ICD-10-CM

## 2021-07-29 DIAGNOSIS — O99345 Other mental disorders complicating the puerperium: Secondary | ICD-10-CM

## 2021-07-29 NOTE — Progress Notes (Signed)
Virtual Visit via Video Note  I connected with Anne Clark on 07/30/21 at  2:00 PM EDT by a video enabled telemedicine application and verified that I am speaking with the correct person using two identifiers.  Location: Patient: Home Provider: Office   I discussed the limitations of evaluation and management by telemedicine and the availability of in person appointments. The patient expressed understanding and agreed to proceed.  History of Present Illness: Anne Clark is a 35 y.o. G53P1023 female who presents for televisit for assessment of her anxiety.  She is approximately  She has a history of ovarian cyst. 8 weeks postpartum s/p uncomplicated vaginal delivery (waterbirth).  She missed her 6 week postpartum visit due to provider no longer being at the office. She reports that she is not experiencing any bleeding, her cycles have not resumed. She is currently breastfeeding, doing well, feeding ~ every 2-3 hours. Notes milk supply is adequate, although over the past several days has decreased slightly due to her acquiring COVID infection.  Currently denies major symptoms at this time, however did note several subjective fevers and body aches for the first several days, treated with Tylenol. Notes several other family members are also positive. Plans on vasectomy for contraception, but will use condoms during the interim.   Anne Clark notes that she had some anxiety (and possibly mild undiagnosed depression) during her pregnancy, however her anxiety has heightened since her birth.  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 She notes that she is constantly worried about even small things. Has increased stress at home (struggling to deal with caring for an infant along with her 57 year old and 3 year old).  She is grinding her teeth at night causing her gums to  bleed, has seen her dentist and notes that she has cracked several molars due to grinding. Desires to extend her work leave, for 90 days in order to get her anxiety better controlled as she notes she cannot go back in her current state.  Requesting paperwork to be completed.    The following portions of the patient's history were reviewed and updated as appropriate: allergies, current medications, past family history, past medical history, past social history, past surgical history, and problem list.    Observations/Objective: currently breastfeeding.  Patient has not weighed herself recently.  Last height 5'9   General Appearance: no acute distress Psych: normal mood, normal affect.    Depression screen [facility] 2/9 07/29/2021 06/02/2021 03/23/2021  Decreased Interest 0 0 0  Down, Depressed, Hopeless 0 0 0  PHQ - 2 Score 0 0 0  Altered sleeping - 0 -  Tired, decreased energy - 0 -  Change in appetite - 0 -  Feeling bad or failure about yourself  - 0 -  Trouble concentrating - 0 -  Moving slowly or fidgety/restless - 0 -  Suicidal thoughts - 0 -  PHQ-9 Score - 0 -     GAD 7 : Generalized Anxiety Score 07/29/2021 06/02/2021  Nervous, Anxious, on Edge 3 0  Control/stop worrying 3 0  Worry too much - different things 3 0  Trouble relaxing 3 0  Restless 2 0  Easily annoyed or irritable 3 0  Afraid - awful might happen 2 0  Total GAD 7 Score 19 0  Anxiety Difficulty Very difficult -  Assessment and Plan: Perinatal anxiety - Discussion had with patient today regarding symptoms.  Elevated GAD-7 score consistent with symptoms of postpartum anxiety. No depression present at this time.  Patient notes that she has good support at home.  Advised on getting as much rest as possible and utilizing her support system. Discussed options for management, including counseling, medications, or both.  Patient desires counseling for now.  Will refer. To follow up in 1 month. Will extend patient's  FMLA to now include short term disability.  Postpartum state - overall well healed postpartum. No issues.  Lactating mother - notes breastfeeding is going well, no issues.  COVID-19 infection  - currently with minor symptoms at this time.   Follow Up Instructions:  Patient to f/u in 1 month for reassessment of symptoms.   I discussed the assessment and treatment plan with the patient. The patient was provided an opportunity to ask questions and all were answered. The patient agreed with the plan and demonstrated an understanding of the instructions.   The patient was advised to call back or seek an in-person evaluation if the symptoms worsen or if the condition fails to improve as anticipated.  I provided 13 minutes of non-face-to-face time during this encounter.   Hildred Laser, MD [facility]

## 2021-07-30 ENCOUNTER — Encounter: Payer: Self-pay | Admitting: Obstetrics and Gynecology

## 2021-07-30 NOTE — Patient Instructions (Signed)

## 2021-08-03 ENCOUNTER — Encounter: Payer: BC Managed Care – PPO | Admitting: Obstetrics and Gynecology

## 2021-08-17 ENCOUNTER — Encounter: Payer: Self-pay | Admitting: Obstetrics and Gynecology

## 2021-08-17 ENCOUNTER — Ambulatory Visit (INDEPENDENT_AMBULATORY_CARE_PROVIDER_SITE_OTHER): Payer: BC Managed Care – PPO | Admitting: Obstetrics and Gynecology

## 2021-08-17 ENCOUNTER — Other Ambulatory Visit (HOSPITAL_COMMUNITY)
Admission: RE | Admit: 2021-08-17 | Discharge: 2021-08-17 | Disposition: A | Discharge status: Home / Self Care | Payer: BC Managed Care – PPO | Source: Ambulatory Visit | Attending: Obstetrics and Gynecology | Admitting: Obstetrics and Gynecology

## 2021-08-17 ENCOUNTER — Other Ambulatory Visit: Payer: Self-pay

## 2021-08-17 VITALS — BP 128/76 | HR 68 | Resp 18 | Ht 69.0 in | Wt 260.2 lb

## 2021-08-17 DIAGNOSIS — O99345 Other mental disorders complicating the puerperium: Secondary | ICD-10-CM | POA: Diagnosis not present

## 2021-08-17 DIAGNOSIS — Z124 Encounter for screening for malignant neoplasm of cervix: Secondary | ICD-10-CM

## 2021-08-17 DIAGNOSIS — R102 Pelvic and perineal pain: Secondary | ICD-10-CM | POA: Diagnosis not present

## 2021-08-17 DIAGNOSIS — F418 Other specified anxiety disorders: Secondary | ICD-10-CM

## 2021-08-17 DIAGNOSIS — Z01419 Encounter for gynecological examination (general) (routine) without abnormal findings: Secondary | ICD-10-CM

## 2021-08-17 MED ORDER — BUSPIRONE HCL 5 MG PO TABS: 5.0000 mg | ORAL_TABLET | Freq: Three times a day (TID) | ORAL | 1 refills | Status: DC

## 2021-08-17 NOTE — Progress Notes (Signed)
OBSTETRICS POSTPARTUM CLINIC PROGRESS NOTE  Subjective:     Anne Clark is a 35 y.o. G34P1023 female who presents for a postpartum visit/annual exam. She is  13  weeks postpartum following a spontaneous vaginal delivery, uncomplicated water birth at term (37 weeks . I have fully reviewed the prenatal and intrapartum course. Postpartum course has been complicated, she was dx with postpartum anxiety. Is planning on beginning therapy, has an appointment scheduled in 1-2 weeks. Bleeding: patient has resumed menses, with Patient's last menstrual period was 07/15/2021.Marland Kitchen Bowel function is normal. Bladder function is normal. Patient is sexually active. Contraception method is vasectomy.    Patient has the following complaints today:  She continues to have groin pain, she has had it throughout her pregnancy. Pain comes and goes, usually in the morning when she wakes up or when she walks for a long period of time. She has been taking Ibuprofen to relieve the pain and it helps.  Is wearing her postpartum compression garment regularly.     Gynecologic History:   Patient's last menstrual period was 07/15/2021. Last pap smear: normal, 04/29/2018.  History of abnormal pap smear in 2015, LGSIL, had normal colposcopy.  History of STI's: Denies Contraception: vasectomy   OB History  Gravida Para Term Preterm AB Living  5 3 1   2 3   SAB IAB Ectopic Multiple Live Births  2     0 3    # Outcome Date GA Lbr Len/2nd Weight Sex Delivery Anes PTL Lv  5 Term 05/18/21 [redacted]w[redacted]d 09:20 / 00:10 7 lb 10.1 oz (3.46 kg) M Vag-Spont Other  LIV  4 Para 03/12/19 [redacted]w[redacted]d  6 lb 8 oz (2.948 kg) F  None N LIV  3 Para 04/02/17 [redacted]w[redacted]d  7 lb 2 oz (3.232 kg) F Vag-Spont Other N LIV  2 SAB 2015 [redacted]w[redacted]d         1 SAB 2013 [redacted]w[redacted]d            Complications: History of D&C    Past Medical History:  Diagnosis Date   Anemia     Family History  Problem Relation Age of Onset   Diabetes Father    Hypertension Father    Breast  cancer Paternal Aunt     Past Surgical History:  Procedure Laterality Date   DILATION AND CURETTAGE OF UTERUS     DILATION AND EVACUATION  05/20/2012   missed ab    Social History   Socioeconomic History   Marital status: Married    Spouse name: Abijah   Number of children: Not on file   Years of education: Not on file   Highest education level: Not on file  Occupational History   Not on file  Tobacco Use   Smoking status: Never   Smokeless tobacco: Never  Vaping Use   Vaping Use: Never used  Substance and Sexual Activity   Alcohol use: Not Currently    Alcohol/week: 1.0 standard drink    Types: 1 Standard drinks or equivalent per week   Drug use: No   Sexual activity: Yes    Partners: Male    Birth control/protection: None, Surgical    Comment: planning  Other Topics Concern   Not on file  Social History Narrative   Not on file   Social Determinants of Health   Financial Resource Strain: Not on file  Food Insecurity: Not on file  Transportation Needs: Not on file  Physical Activity: Not on file  Stress: Not  on file  Social Connections: Not on file  Intimate Partner Violence: Not on file    Current Outpatient Medications on File Prior to Visit  Medication Sig Dispense Refill   ergocalciferol (VITAMIN D2) 50000 UNITS capsule Take 50,000 Units by mouth once a week.     ibuprofen (ADVIL) 600 MG tablet Take 1 tablet (600 mg total) by mouth every 6 (six) hours. 30 tablet 0   Prenatal Vit-Fe Fumarate-FA (PRENATAL MULTIVITAMIN) TABS tablet Take 1 tablet by mouth daily at 12 noon.     No current facility-administered medications on file prior to visit.    No Known Allergies     Review of Systems Constitutional: negative for chills, fatigue, fevers and sweats Eyes: negative for irritation, redness and visual disturbance Ears, nose, mouth, throat, and face: negative for hearing loss, nasal congestion, snoring and tinnitus Respiratory: negative for asthma,  cough, sputum Cardiovascular: negative for chest pain, dyspnea, exertional chest pressure/discomfort, irregular heart beat, palpitations and syncope Gastrointestinal: negative for abdominal pain, change in bowel habits, nausea and vomiting Genitourinary: negative for abnormal menstrual periods, genital lesions, sexual problems and vaginal discharge, dysuria and urinary incontinence. Positive for pelvic pressure/pain.  Integument/breast: negative for breast lump, breast tenderness and nipple discharge Hematologic/lymphatic: negative for bleeding and easy bruising Musculoskeletal:negative for back pain and muscle weakness Neurological: negative for dizziness, headaches, vertigo and weakness Endocrine: negative for diabetic symptoms including polydipsia, polyuria and skin dryness Allergic/Immunologic: negative for hay fever and urticaria      Objective:    BP 128/76 (BP Location: Left Arm)   Pulse 68   Resp 18   Ht 5\' 9"  (1.753 m)   Wt 260 lb 4 oz (118 kg)   LMP 07/15/2021   Breastfeeding Yes   BMI 38.43 kg/m   General:  alert and no distress   Breasts:  inspection negative, no nipple discharge or bleeding, no masses or nodularity palpable  Lungs: clear to auscultation bilaterally  Heart:  regular rate and rhythm, S1, S2 normal, no murmur, click, rub or gallop  Abdomen: soft, non-tender; bowel sounds normal; no masses,  no organomegaly.     Vulva:  normal  Vagina: normal vagina, no discharge, exudate, lesion, or erythema  Cervix:  no cervical motion tenderness and no lesions  Corpus: normal size, contour, position, consistency, mobility, non-tender  Adnexa:  normal adnexa and no mass, fullness, tenderness  Rectal Exam: Not performed.          Depression screen [facility] 2/9 07/29/2021 06/02/2021 03/23/2021  Decreased Interest 0 0 0  Down, Depressed, Hopeless 0 0 0  PHQ - 2 Score 0 0 0  Altered sleeping - 0 -  Tired, decreased energy - 0 -  Change in appetite - 0 -  Feeling bad or  failure about yourself  - 0 -  Trouble concentrating - 0 -  Moving slowly or fidgety/restless - 0 -  Suicidal thoughts - 0 -  PHQ-9 Score - 0 -    GAD 7 : Generalized Anxiety Score 07/29/2021 06/02/2021  Nervous, Anxious, on Edge 3 0  Control/stop worrying 3 0  Worry too much - different things 3 0  Trouble relaxing 3 0  Restless 2 0  Easily annoyed or irritable 3 0  Afraid - awful might happen 2 0  Total GAD 7 Score 19 0  Anxiety Difficulty Very difficult -     Labs:  Lab Results  Component Value Date   WBC 14.2 (H) 05/19/2021   HGB 10.9 (L)  05/19/2021   HCT 32.6 (L) 05/19/2021   MCV 80.9 05/19/2021   PLT 321 05/19/2021    CMP Latest Ref Rng & Units 05/08/2021  Glucose 70 - 99 mg/dL 154(M)  BUN 6 - 20 mg/dL <0(Q)  Creatinine 6.76 - 1.00 mg/dL 1.95  Sodium 093 - 267 mmol/L 134(L)  Potassium 3.5 - 5.1 mmol/L 3.8  Chloride 98 - 111 mmol/L 103  CO2 22 - 32 mmol/L 23  Calcium 8.9 - 10.3 mg/dL 1.2(W)  Total Protein 6.5 - 8.1 g/dL 6.5  Total Bilirubin 0.3 - 1.2 mg/dL 0.5  Alkaline Phos 38 - 126 U/L 66  AST 15 - 41 U/L 19  ALT 0 - 44 U/L 16     No results found for: CHOL, HDL, LDLCALC, LDLDIRECT, TRIG, CHOLHDL  Lab Results  Component Value Date   HGBA1C 5.6 12/30/2020    Assessment:   1. Visit for gynecologic examination   2. Postpartum anxiety   3. Pelvic pain   4. Cervical cancer screening   5. Lactating mother      Plan:   Labs: CBC and CMP ordered.  Contraception: vasectomy Discussed healthy lifestyle modifications Cervical cancer screening: pap smear up to date. Can screen q 5 years.  Postpartum anxiety, plans to begin counseling soon. Also can have prescription for Buspar which she can use in conjunction with therapy, vs therapy alone.  Pelvic pain (feels heavy pelvic pressure, pulling sensation in her pelvis, sometimes difficult to stand for long periods of time, affects childcare). Discussed option of stretches/exercises to help reinforce pelvis.  Can also consider referral to pelvic floor physical therapy. Patient notes that if symptoms fail to improve or worsen, she will utilize therapy.  Lactating mother, no issues. Doing well with breasteeding.  COVID vaccination status: declines Follow up in 1 year for annual exam.    Hildred Laser, MD [facility]

## 2021-08-17 NOTE — Patient Instructions (Signed)
Health Maintenance, Female Adopting a healthy lifestyle and getting preventive care are important in promoting health and wellness. Ask your health care provider about: The right schedule for you to have regular tests and exams. Things you can do on your own to prevent diseases and keep yourself healthy. What should I know about diet, weight, and exercise? Eat a healthy diet  Eat a diet that includes plenty of vegetables, fruits, low-fat dairy products, and lean protein. Do not eat a lot of foods that are high in solid fats, added sugars, or sodium. Maintain a healthy weight Body mass index (BMI) is used to identify weight problems. It estimates body fat based on height and weight. Your health care provider can help determine your BMI and help you achieve or maintain a healthy weight. Get regular exercise Get regular exercise. This is one of the most important things you can do for your health. Most adults should: Exercise for at least 150 minutes each week. The exercise should increase your heart rate and make you sweat (moderate-intensity exercise). Do strengthening exercises at least twice a week. This is in addition to the moderate-intensity exercise. Spend less time sitting. Even light physical activity can be beneficial. Watch cholesterol and blood lipids Have your blood tested for lipids and cholesterol at 36 years of age, then have this test every 5 years. Have your cholesterol levels checked more often if: Your lipid or cholesterol levels are high. You are older than 36 years of age. You are at high risk for heart disease. What should I know about cancer screening? Depending on your health history and family history, you may need to have cancer screening at various ages. This may include screening for: Breast cancer. Cervical cancer. Colorectal cancer. Skin cancer. Lung cancer. What should I know about heart disease, diabetes, and high blood pressure? Blood pressure and heart  disease High blood pressure causes heart disease and increases the risk of stroke. This is more likely to develop in people who have high blood pressure readings, are of African descent, or are overweight. Have your blood pressure checked: Every 3-5 years if you are 18-39 years of age. Every year if you are 40 years old or older. Diabetes Have regular diabetes screenings. This checks your fasting blood sugar level. Have the screening done: Once every three years after age 40 if you are at a normal weight and have a low risk for diabetes. More often and at a younger age if you are overweight or have a high risk for diabetes. What should I know about preventing infection? Hepatitis B If you have a higher risk for hepatitis B, you should be screened for this virus. Talk with your health care provider to find out if you are at risk for hepatitis B infection. Hepatitis C Testing is recommended for: Everyone born from 1945 through 1965. Anyone with known risk factors for hepatitis C. Sexually transmitted infections (STIs) Get screened for STIs, including gonorrhea and chlamydia, if: You are sexually active and are younger than 36 years of age. You are older than 36 years of age and your health care provider tells you that you are at risk for this type of infection. Your sexual activity has changed since you were last screened, and you are at increased risk for chlamydia or gonorrhea. Ask your health care provider if you are at risk. Ask your health care provider about whether you are at high risk for HIV. Your health care provider may recommend a prescription medicine   to help prevent HIV infection. If you choose to take medicine to prevent HIV, you should first get tested for HIV. You should then be tested every 3 months for as long as you are taking the medicine. Pregnancy If you are about to stop having your period (premenopausal) and you may become pregnant, seek counseling before you get  pregnant. Take 400 to 800 micrograms (mcg) of folic acid every day if you become pregnant. Ask for birth control (contraception) if you want to prevent pregnancy. Osteoporosis and menopause Osteoporosis is a disease in which the bones lose minerals and strength with aging. This can result in bone fractures. If you are 65 years old or older, or if you are at risk for osteoporosis and fractures, ask your health care provider if you should: Be screened for bone loss. Take a calcium or vitamin D supplement to lower your risk of fractures. Be given hormone replacement therapy (HRT) to treat symptoms of menopause. Follow these instructions at home: Lifestyle Do not use any products that contain nicotine or tobacco, such as cigarettes, e-cigarettes, and chewing tobacco. If you need help quitting, ask your health care provider. Do not use street drugs. Do not share needles. Ask your health care provider for help if you need support or information about quitting drugs. Alcohol use Do not drink alcohol if: Your health care provider tells you not to drink. You are pregnant, may be pregnant, or are planning to become pregnant. If you drink alcohol: Limit how much you use to 0-1 drink a day. Limit intake if you are breastfeeding. Be aware of how much alcohol is in your drink. In the [place] one drink equals one 12 oz bottle of beer (355 mL), one 5 oz glass of wine (148 mL), or one 1 oz glass of hard liquor (44 mL). General instructions Schedule regular health, dental, and eye exams. Stay current with your vaccines. Tell your health care provider if: You often feel depressed. You have ever been abused or do not feel safe at home. Summary Adopting a healthy lifestyle and getting preventive care are important in promoting health and wellness. Follow your health care provider's instructions about healthy diet, exercising, and getting tested or screened for diseases. Follow your health care provider's  instructions on monitoring your cholesterol and blood pressure. This information is not intended to replace advice given to you by your health care provider. Make sure you discuss any questions you have with your health care provider. Document Revised: 02/04/2021 Document Reviewed: 11/20/2018 Elsevier Patient Education  2022 [employer] Inc.  

## 2021-08-18 LAB — COMPREHENSIVE METABOLIC PANEL
ALT: 26 IU/L (ref 0–32)
AST: 20 IU/L (ref 0–40)
Albumin/Globulin Ratio: 1.6 (ref 1.2–2.2)
Albumin: 4.4 g/dL (ref 3.8–4.8)
Alkaline Phosphatase: 66 IU/L (ref 44–121)
BUN/Creatinine Ratio: 12 (ref 9–23)
BUN: 8 mg/dL (ref 6–20)
Bilirubin Total: 0.3 mg/dL (ref 0.0–1.2)
CO2: 24 mmol/L (ref 20–29)
Calcium: 9.4 mg/dL (ref 8.7–10.2)
Chloride: 100 mmol/L (ref 96–106)
Creatinine, Ser: 0.67 mg/dL (ref 0.57–1.00)
Globulin, Total: 2.8 g/dL (ref 1.5–4.5)
Glucose: 85 mg/dL (ref 65–99)
Potassium: 4.2 mmol/L (ref 3.5–5.2)
Sodium: 139 mmol/L (ref 134–144)
Total Protein: 7.2 g/dL (ref 6.0–8.5)
eGFR: 117 mL/min/{1.73_m2} (ref >59)

## 2021-08-18 LAB — HEMOGLOBIN A1C
Est. average glucose Bld gHb Est-mCnc: 111 mg/dL
Hgb A1c MFr Bld: 5.5 % (ref 4.8–5.6)

## 2021-08-18 LAB — LIPID PANEL
Chol/HDL Ratio: 3 ratio (ref 0.0–4.4)
Cholesterol, Total: 201 mg/dL — ABNORMAL HIGH (ref 100–199)
HDL: 68 mg/dL (ref >39)
LDL Chol Calc (NIH): 115 mg/dL — ABNORMAL HIGH (ref 0–99)
Triglycerides: 101 mg/dL (ref 0–149)
VLDL Cholesterol Cal: 18 mg/dL (ref 5–40)

## 2021-08-24 ENCOUNTER — Ambulatory Visit: Payer: BC Managed Care – PPO | Admitting: Psychologist

## 2021-08-25 LAB — CYTOLOGY - PAP
Comment: NEGATIVE
Diagnosis: NEGATIVE
High risk HPV: NEGATIVE

## 2021-08-26 ENCOUNTER — Ambulatory Visit (INDEPENDENT_AMBULATORY_CARE_PROVIDER_SITE_OTHER): Payer: BC Managed Care – PPO | Admitting: Psychologist

## 2021-08-26 DIAGNOSIS — F411 Generalized anxiety disorder: Secondary | ICD-10-CM | POA: Diagnosis not present

## 2021-09-07 ENCOUNTER — Ambulatory Visit (INDEPENDENT_AMBULATORY_CARE_PROVIDER_SITE_OTHER): Payer: BC Managed Care – PPO | Admitting: Psychologist

## 2021-09-07 DIAGNOSIS — F411 Generalized anxiety disorder: Secondary | ICD-10-CM | POA: Diagnosis not present

## 2021-09-23 ENCOUNTER — Ambulatory Visit: Payer: BC Managed Care – PPO | Admitting: Psychologist

## 2021-09-26 ENCOUNTER — Ambulatory Visit: Payer: BC Managed Care – PPO | Admitting: Psychologist

## 2021-10-16 IMAGING — DX DG CHEST 2V
2 series · 2 of 2 positions shown · non-contrast
Comparison: None.

CLINICAL DATA: Chest pain

EXAM:
CHEST - 2 VIEW

[chest pa]
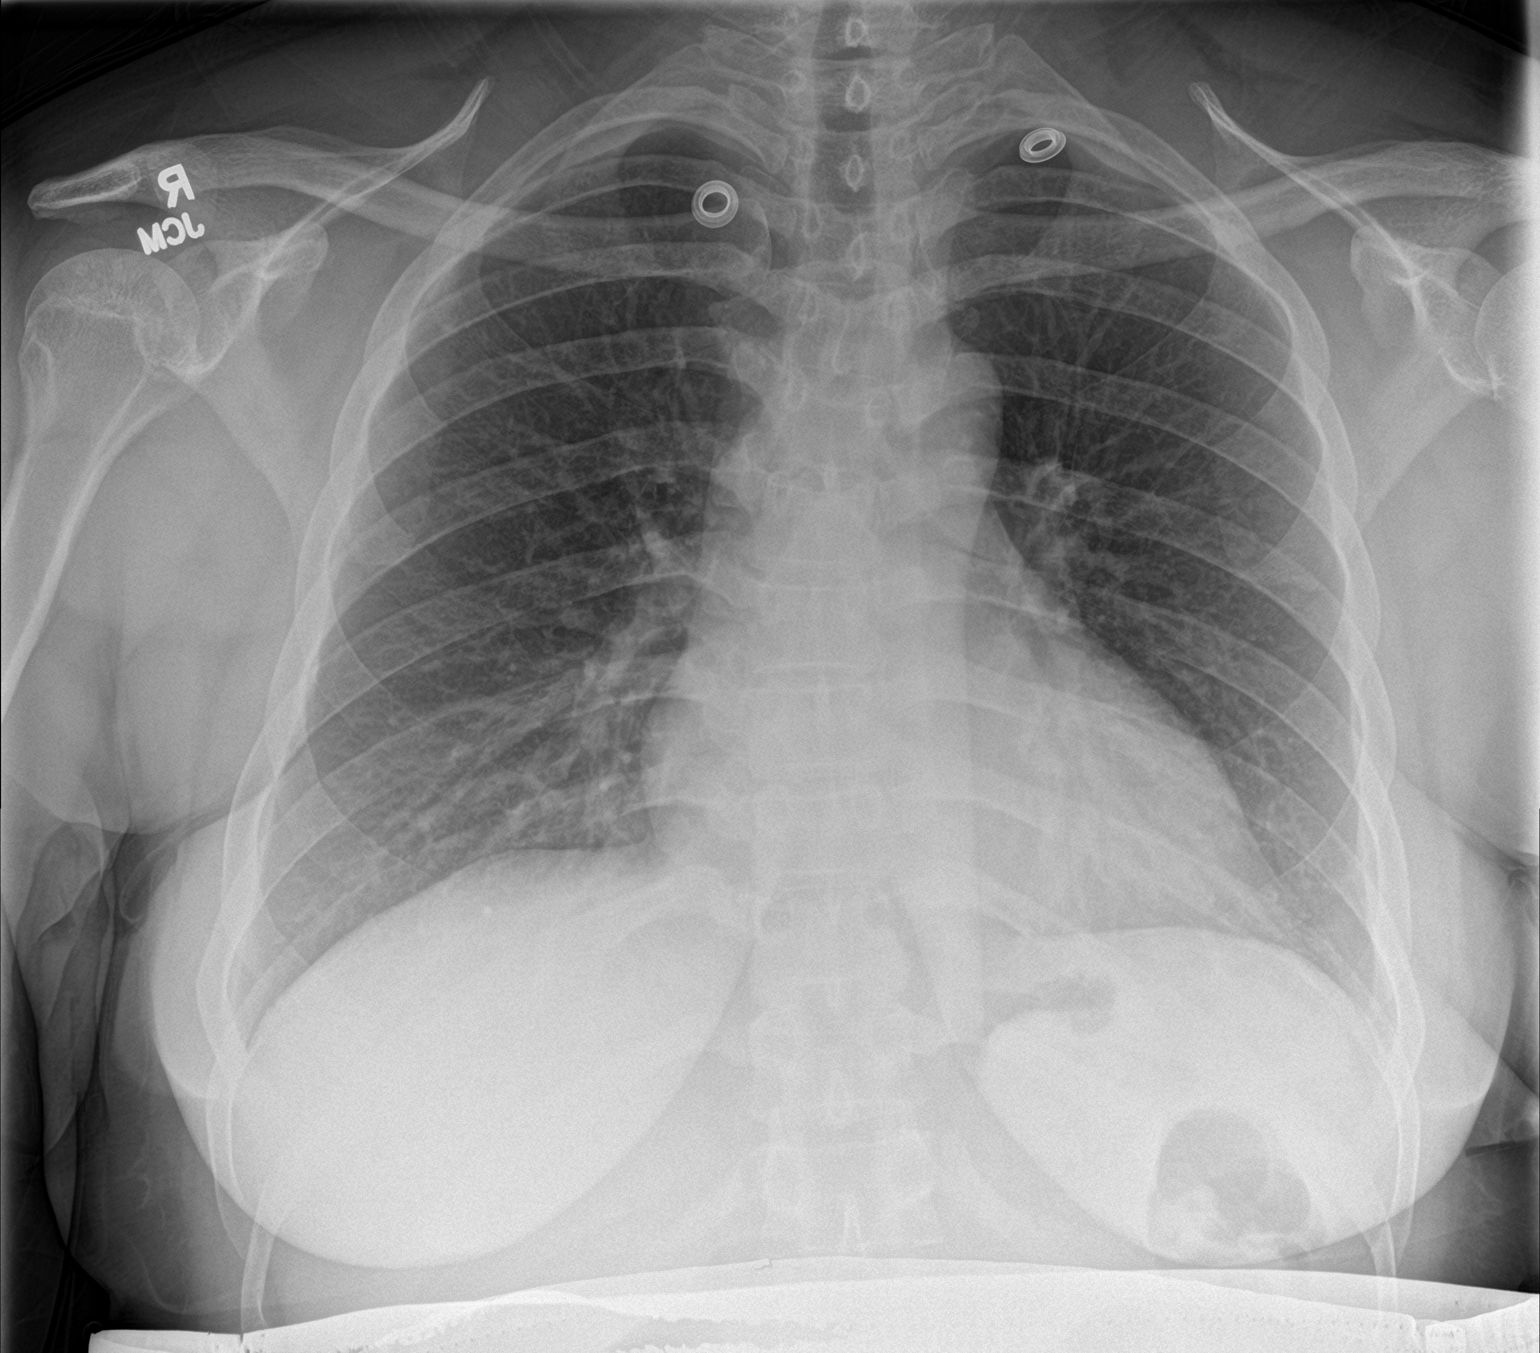

[chest lat]
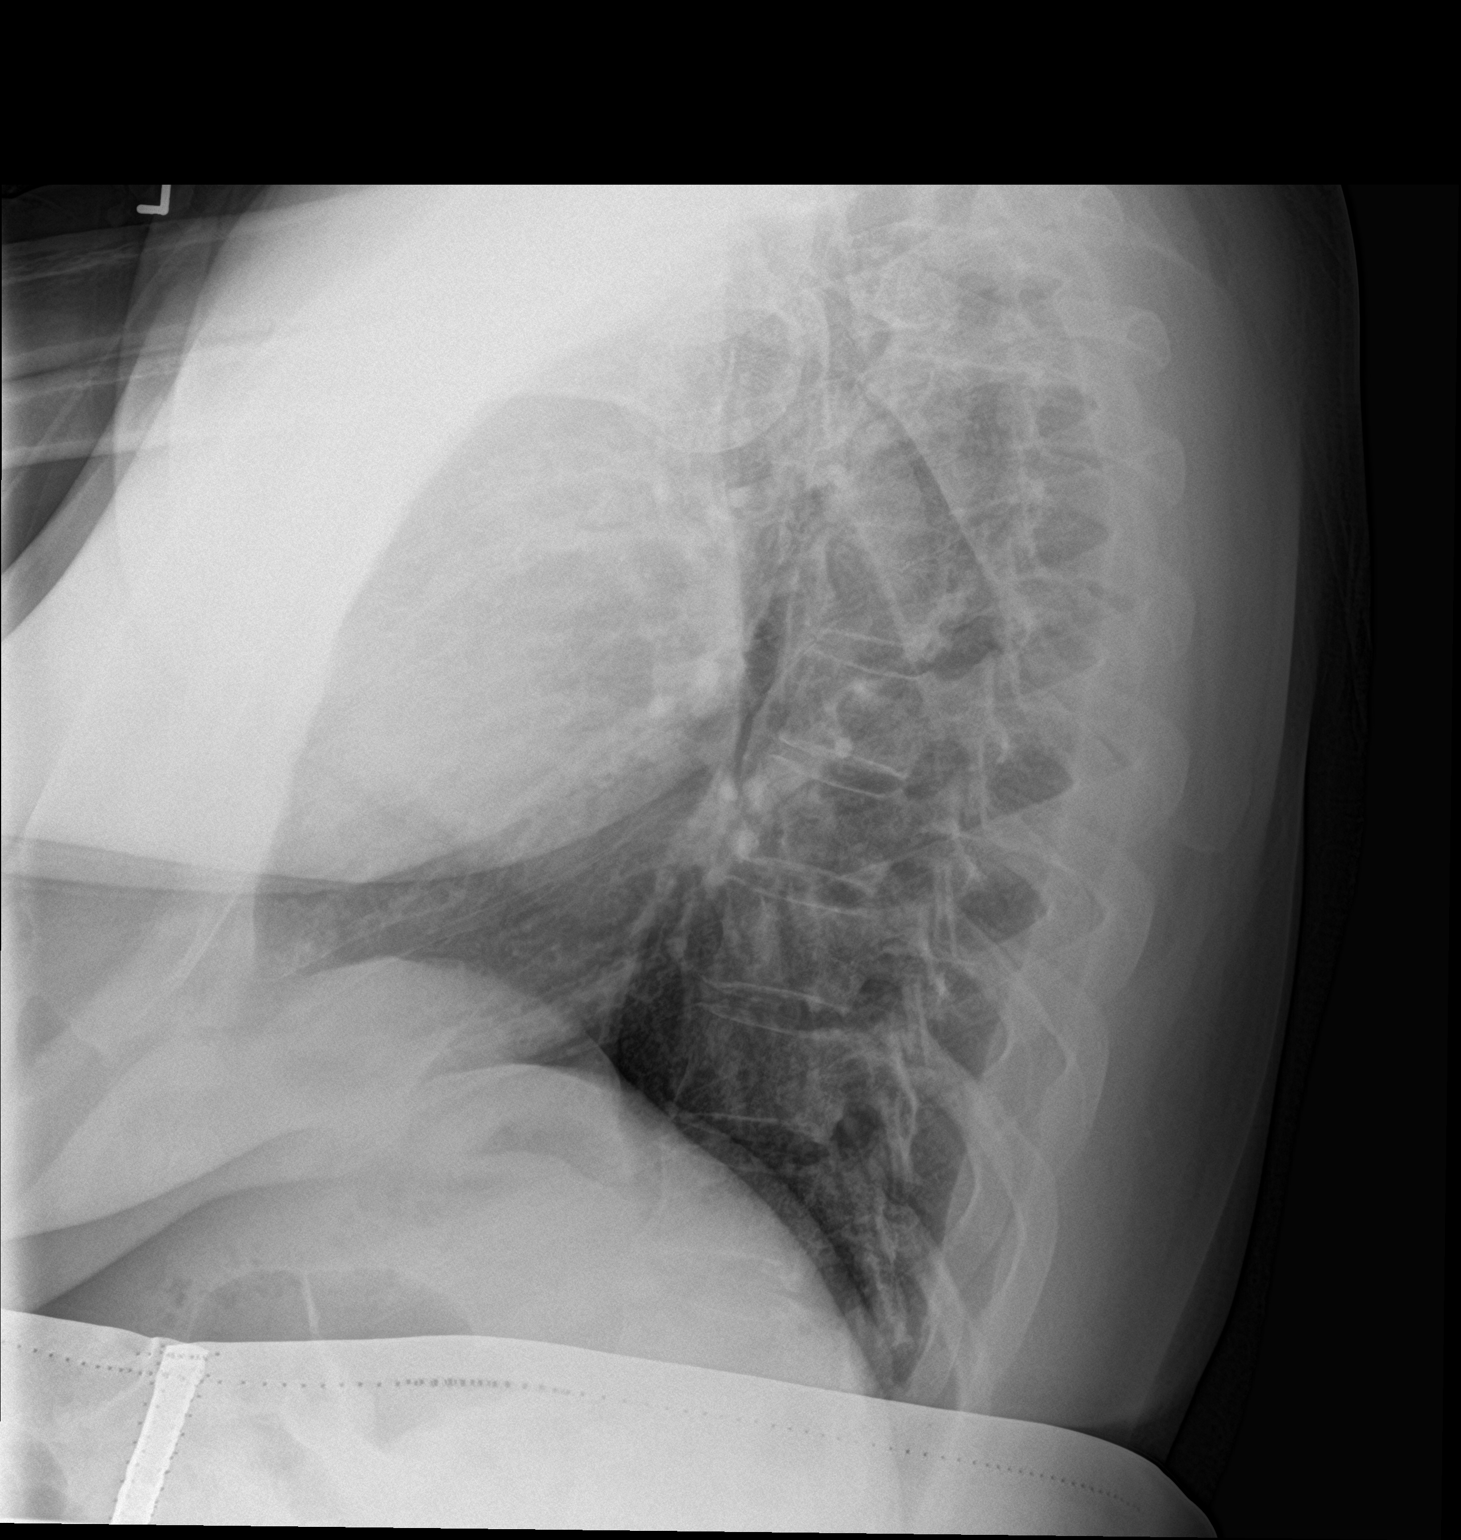

[2 of 2 positions shown; findings below may reference images not displayed]

FINDINGS: The heart size and mediastinal contours are within normal limits.
Both lungs are clear. The visualized skeletal structures are
unremarkable.
IMPRESSION: No active cardiopulmonary disease.

## 2021-10-28 ENCOUNTER — Encounter: Payer: Self-pay | Admitting: Obstetrics and Gynecology

## 2021-10-28 ENCOUNTER — Ambulatory Visit (INDEPENDENT_AMBULATORY_CARE_PROVIDER_SITE_OTHER): Payer: BC Managed Care – PPO | Admitting: Psychologist

## 2021-10-28 DIAGNOSIS — F411 Generalized anxiety disorder: Secondary | ICD-10-CM | POA: Diagnosis not present

## 2021-10-31 ENCOUNTER — Encounter: Payer: Self-pay | Admitting: Obstetrics and Gynecology

## 2021-10-31 ENCOUNTER — Telehealth (INDEPENDENT_AMBULATORY_CARE_PROVIDER_SITE_OTHER): Payer: BC Managed Care – PPO | Admitting: Obstetrics and Gynecology

## 2021-10-31 ENCOUNTER — Other Ambulatory Visit: Payer: Self-pay | Admitting: Obstetrics and Gynecology

## 2021-10-31 ENCOUNTER — Other Ambulatory Visit: Payer: Self-pay

## 2021-10-31 VITALS — Ht 69.0 in

## 2021-10-31 DIAGNOSIS — F418 Other specified anxiety disorders: Secondary | ICD-10-CM

## 2021-10-31 DIAGNOSIS — F419 Anxiety disorder, unspecified: Secondary | ICD-10-CM

## 2021-10-31 DIAGNOSIS — F32A Depression, unspecified: Secondary | ICD-10-CM

## 2021-10-31 DIAGNOSIS — F4321 Adjustment disorder with depressed mood: Secondary | ICD-10-CM

## 2021-10-31 MED ORDER — HYDROXYZINE HCL 25 MG PO TABS
25.0000 mg | ORAL_TABLET | Freq: Four times a day (QID) | ORAL | 2 refills | Status: DC | PRN
Start: 2021-10-31 — End: 2021-12-27

## 2021-10-31 MED ORDER — SERTRALINE HCL 50 MG PO TABS: 50.0000 mg | ORAL_TABLET | Freq: Every day | ORAL | 1 refills | Status: DC

## 2021-10-31 NOTE — Progress Notes (Signed)
Patient's visit completed in a different encounter session.

## 2021-10-31 NOTE — Progress Notes (Signed)
Virtual Visit via Video Note  I connected with Anne Clark on 10/31/21 at 12:15 PM EST by a video enabled telemedicine application and verified that I am speaking with the correct person using two identifiers.  Location: Patient: Set designer Provider: Office   I discussed the limitations of evaluation and management by telemedicine and the availability of in person appointments. The patient expressed understanding and agreed to proceed.  History of Present Illness:  Anne Clark is a 35 y.o. G54P1023 female who presents for 36-month follow-up of postpartum anxiety.  She is approximately 5 months postpartum.  Patient was initiated on BuSpar last visit due to significant anxiety to the point where she was not able to perform activities of daily living nor job duties.  Has since that time established with a counselor that she has been seeing fairly regularly.  Patient notes that she discontinued the BuSpar at the end of October.  During the same month, patient notes that she lost her father unexpectedly and began noting some symptoms of depression.  She was feeling that the BuSpar was making her depression symptoms worse.  Is unsure if she is truly experiencing depression or grief.  States that she was supposed to return back to work this week, however does not feel like she is in a good place.  She does mention that she is no longer breast-feeding, notes that after the death of her father and likely due to the stress, that her milk supply began to dry up.  She is now currently feeding her son with organic formula.  Notes that this also has affected her mood as she was able to breast-feed both of her other children for at least a year.   Observations/Objective: Height 5\' 9"  (1.753 m), not currently breastfeeding. Gen App: No acute distress Psych: tearful, depressed mood, normal affect.    Depression screen [facility] 2/9 10/31/2021 07/29/2021 06/02/2021 03/23/2021  Decreased Interest 2 0 0 0  Down,  Depressed, Hopeless 3 0 0 0  PHQ - 2 Score 5 0 0 0  Altered sleeping 3 - 0 -  Tired, decreased energy 3 - 0 -  Change in appetite 2 - 0 -  Feeling bad or failure about yourself  0 - 0 -  Trouble concentrating 3 - 0 -  Moving slowly or fidgety/restless 0 - 0 -  Suicidal thoughts 0 - 0 -  PHQ-9 Score 16 - 0 -    GAD 7 : Generalized Anxiety Score 10/31/2021 07/29/2021 06/02/2021  Nervous, Anxious, on Edge 3 3 0  Control/stop worrying 2 3 0  Worry too much - different things 3 3 0  Trouble relaxing 3 3 0  Restless 0 2 0  Easily annoyed or irritable 3 3 0  Afraid - awful might happen 3 2 0  Total GAD 7 Score 17 19 0  Anxiety Difficulty - Very difficult -     Assessment :   Anxiety and depression Grief  Plan: -Discussed patient's concerns.  Both PHQ-9 and GAD-7 scores are elevated today.  Patient does not desire to return to use of BuSpar.  I discussed initiation of Zoloft as it can help to manage both depression and anxiety.  Also offered trial of Atarax for better management of anxiety as patient feels like she would desire something more natural.  Advised that Zoloft should be taken for at least the next 3 months, and after that time can be reevaluated.  Patient initially noted concerns for requiring long-term therapy  as part of her hesitation for not beginning medication before.  I discussed her concerns.  Patient is okay to use for at least 3 months with reassessment.  She will also continue counseling and plans to have an increase in her session number. -Patient desires extension from work as she is now dealing with the grief and depression from the loss of her father as well as initially dealing with significant postpartum anxiety after the birth of her child.  States that she is struggling at this time to even fully manage her household.  We will extend her leave for 8 weeks and reevaluate.  Patient can send in paperwork that is required.  Follow Up Instructions:    I discussed  the assessment and treatment plan with the patient. The patient was provided an opportunity to ask questions and all were answered. The patient agreed with the plan and demonstrated an understanding of the instructions.   The patient was advised to call back or seek an in-person evaluation if the symptoms worsen or if the condition fails to improve as anticipated.  I provided 18 minutes of -face-to-face time during this encounter.   Hildred Laser, MD [facility]

## 2021-10-31 NOTE — Patient Instructions (Signed)
Managing Loss, Adult °People experience loss in many different ways throughout their lives. Events such as moving, changing jobs, and losing friends can create a sense of loss. The loss may be as serious as a major health change, divorce, death of a pet, or death of a loved one. All of these types of loss are likely to create a physical and emotional reaction known as grief. Grief is the result of a major change or an absence of something or someone that you count on. Grief is a normal reaction to loss. °A variety of factors can affect your grieving experience, including: °The nature of your loss. °Your relationship to what or whom you lost. °Your understanding of grief and how to manage it. °Your support system. °Be aware that when grief becomes extreme, it can lead to more severe issues like isolation, depression, anxiety, or suicidal thoughts. Talk with your health care provider if you have any of these issues. °How to manage lifestyle changes °Keep to your normal routine as much as possible. °If you have trouble focusing or doing normal activities, it is acceptable to take some time away from your normal routine. °Spend time with friends and loved ones. °Eat a healthy diet, get plenty of sleep, and rest when you feel tired. °How to recognize changes  °The way that you deal with your grief will affect your ability to function as you normally do. When grieving, you may experience these changes: °Numbness, shock, sadness, anxiety, anger, denial, and guilt. °Thoughts about death. °Unexpected crying. °A physical sensation of emptiness in your stomach. °Problems sleeping and eating. °Tiredness (fatigue). °Loss of interest in normal activities. °Dreaming about or imagining seeing the person who died. °A need to remember what or whom you lost. °Difficulty thinking about anything other than your loss for a period of time. °Relief. If you have been expecting the loss for a while, you may feel a sense of relief when it  happens. °Follow these instructions at home: °Activity °Express your feelings in healthy ways, such as: °Talking with others about your loss. It may be helpful to find others who have had a similar loss, such as a support group. °Writing down your feelings in a journal. °Doing physical activities to release stress and emotional energy. °Doing creative activities like painting, sculpting, or playing or listening to music. °Practicing resilience. This is the ability to recover and adjust after facing challenges. Reading some resources that encourage resilience may help you to learn ways to practice those behaviors. ° °General instructions °Be patient with yourself and others. Allow the grieving process to happen, and remember that grieving takes time. °It is likely that you may never feel completely done with some grief. You may find a way to move on while still cherishing memories and feelings about your loss. °Accepting your loss is a process. It can take months or longer to adjust. °Keep all follow-up visits. This is important. °Where to find support °To get support for managing loss: °Ask your health care provider for help and recommendations, such as grief counseling or therapy. °Think about joining a support group for people who are managing a loss. °Where to find more information °You can find more information about managing loss from: °American Society of Clinical Oncology: www.cancer.net °American Psychological Association: www.apa.org °Contact a health care provider if: °Your grief is extreme and keeps getting worse. °You have ongoing grief that does not improve. °Your body shows symptoms of grief, such as illness. °You feel depressed, anxious, or   hopeless. °Get help right away if: °You have thoughts about hurting yourself or others. °Get help right away if you feel like you may hurt yourself or others, or have thoughts about taking your own life. Go to your nearest emergency room or: °Call 911. °Call the  National Suicide Prevention Lifeline at 1-800-273-8255 or 988. This is open 24 hours a day. °Text the Crisis Text Line at 741741. °Summary °Grief is the result of a major change or an absence of someone or something that you count on. Grief is a normal reaction to loss. °The depth of grief and the period of recovery depend on the type of loss and your ability to adjust to the change and process your feelings. °Processing grief requires patience and a willingness to accept your feelings and talk about your loss with people who are supportive. °It is important to find resources that work for you and to realize that people experience grief differently. There is not one grieving process that works for everyone in the same way. °Be aware that when grief becomes extreme, it can lead to more severe issues like isolation, depression, anxiety, or suicidal thoughts. Talk with your health care provider if you have any of these issues. °This information is not intended to replace advice given to you by your health care provider. Make sure you discuss any questions you have with your health care provider. °Document Revised: 07/18/2021 Document Reviewed: 07/18/2021 °Elsevier Patient Education © 2022 [employer] Inc. ° °

## 2021-11-25 ENCOUNTER — Ambulatory Visit: Payer: BC Managed Care – PPO | Admitting: Psychologist

## 2021-11-30 ENCOUNTER — Encounter: Payer: Self-pay | Admitting: Obstetrics and Gynecology

## 2021-11-30 ENCOUNTER — Telehealth (INDEPENDENT_AMBULATORY_CARE_PROVIDER_SITE_OTHER): Payer: BC Managed Care – PPO | Admitting: Obstetrics and Gynecology

## 2021-11-30 VITALS — Resp 16 | Ht 69.0 in | Wt 228.0 lb

## 2021-11-30 DIAGNOSIS — F32A Depression, unspecified: Secondary | ICD-10-CM

## 2021-11-30 DIAGNOSIS — R232 Flushing: Secondary | ICD-10-CM | POA: Diagnosis not present

## 2021-11-30 DIAGNOSIS — F4321 Adjustment disorder with depressed mood: Secondary | ICD-10-CM | POA: Diagnosis not present

## 2021-11-30 DIAGNOSIS — F419 Anxiety disorder, unspecified: Secondary | ICD-10-CM

## 2021-11-30 NOTE — Progress Notes (Signed)
° °  Virtual Visit via Video Note  Subjective:   I connected with Anne Clark on 11/30/21 at  9:45 AM EST by a video enabled telemedicine application and verified that I am speaking with the correct person using two identifiers.  Location: Patient: Home Provider: Office   I discussed the limitations of evaluation and management by telemedicine and the availability of in person appointments. The patient expressed understanding and agreed to proceed.  History of Present Illness:  Anne Clark is a 35 y.o. G60P1023 female who presents for follow up of postpartum depression, grief, and anxiety. She has no new concerns. Notes that the Zoloft is working fairly well for her. She has been having some hot flashes wonders if it could be a side effect from the medication. Has questions regarding any addictive properties of medication (notes her brother was on Xanax for a time and became addicted to it).   The following portions of the patient's history were reviewed and updated as appropriate: allergies, current medications, past family history, past medical history, past social history, past surgical history, and problem list.   Observations/Objective:  Resp. rate 16, height 5\' 9"  (1.753 m), weight 228 lb (103.4 kg), not currently breastfeeding. Body mass index is 33.67 kg/m.  Gen App: General appearance: alert, cooperative, and no distress Psych: normal appearing mood/affect.   Assessment and Plan: 1. Anxiety and depression - Currently on Zoloft and Atarax.  Notes she feels like the medications are helping. Screening scores improved today. Reassured patient that neither of her medications had addictive properties.   2. Grief - Slowly recovering from loss of her father.    3. Hot flashes Unclear cause. Discussed that this could be due to reproductive hormones, or endocrine causes such as thyroid. Not a likely side effect of the medications. Is noting regular cycles. Will continue to  monitor. If persistent can consider workup with assessment of hormone levels.    Follow Up Instructions: Follow up in 1 month, due to be released to return to work at that time.    I discussed the assessment and treatment plan with the patient. The patient was provided an opportunity to ask questions and all were answered. The patient agreed with the plan and demonstrated an understanding of the instructions.   The patient was advised to call back or seek an in-person evaluation if the symptoms worsen or if the condition fails to improve as anticipated.  I provided 14 minutes of non-face-to-face time during this encounter.   , MD [facility]

## 2021-12-27 ENCOUNTER — Ambulatory Visit (INDEPENDENT_AMBULATORY_CARE_PROVIDER_SITE_OTHER): Payer: BC Managed Care – PPO | Admitting: Obstetrics and Gynecology

## 2021-12-27 ENCOUNTER — Other Ambulatory Visit: Payer: Self-pay

## 2021-12-27 ENCOUNTER — Encounter: Payer: Self-pay | Admitting: Obstetrics and Gynecology

## 2021-12-27 VITALS — BP 120/80 | HR 57 | Ht 69.0 in | Wt 226.0 lb

## 2021-12-27 DIAGNOSIS — F4321 Adjustment disorder with depressed mood: Secondary | ICD-10-CM

## 2021-12-27 DIAGNOSIS — F419 Anxiety disorder, unspecified: Secondary | ICD-10-CM

## 2021-12-27 DIAGNOSIS — F32A Depression, unspecified: Secondary | ICD-10-CM | POA: Diagnosis not present

## 2021-12-27 DIAGNOSIS — Z3009 Encounter for other general counseling and advice on contraception: Secondary | ICD-10-CM | POA: Diagnosis not present

## 2021-12-27 NOTE — Progress Notes (Signed)
° ° °  GYNECOLOGY PROGRESS NOTE  Subjective:    Patient ID: Anne Clark, female    DOB: [birth year], 36 y.o.   MRN: [id number]  HPI  Patient is a 36 y.o. G80P1023 female who presents for 1 month f/u of anxiety and depression in postpartum state, compounded by grief reaction (loss of her father in October). Notes that she is overall doing much better.  Notes that she is planning to return to work at the end of the month.    Patient also desires to discuss contraception. Desires for partner to get vasectomy however he is hesitant.   The following portions of the patient's history were reviewed and updated as appropriate: allergies, current medications, past family history, past medical history, past social history, past surgical history, and problem list.  Review of Systems Pertinent items noted in HPI and remainder of comprehensive ROS otherwise negative.   Objective:   Blood pressure 120/80, pulse (!) 57, height 5\' 9"  (1.753 m), weight 226 lb (102.5 kg), last menstrual period 12/19/2021, SpO2 98 %, not currently breastfeeding. Body mass index is 33.37 kg/m. General appearance: alert and no distress Psych: normal mood and affect.    GAD 7 : Generalized Anxiety Score 12/27/2021 11/30/2021 10/31/2021 07/29/2021  Nervous, Anxious, on Edge 1 0 3 3  Control/stop worrying 0 0 2 3  Worry too much - different things 0 1 3 3   Trouble relaxing 0 1 3 3   Restless 0 0 0 2  Easily annoyed or irritable 1 1 3 3   Afraid - awful might happen 0 1 3 2   Total GAD 7 Score 2 4 17 19   Anxiety Difficulty Not difficult at all - - Very difficult     Depression screen [facility] 2/9 12/27/2021 10/31/2021 07/29/2021 06/02/2021 03/23/2021  Decreased Interest 0 2 0 0 0  Down, Depressed, Hopeless 1 3 0 0 0  PHQ - 2 Score 1 5 0 0 0  Altered sleeping 1 3 - 0 -  Tired, decreased energy 0 3 - 0 -  Change in appetite 0 2 - 0 -  Feeling bad or failure about yourself  0 0 - 0 -  Trouble concentrating 0 3 - 0 -  Moving  slowly or fidgety/restless 0 0 - 0 -  Suicidal thoughts 0 0 - 0 -  PHQ-9 Score 2 16 - 0 -  Difficult doing work/chores Not difficult at all - - - -    Assessment:   1. Anxiety and depression   2. Grief   3. Encounter for counseling regarding contraception     Plan:   Anxiety/depression and grief reaction. Doing well on Zoloft, advised to continue for at least 3 months, and then can consider weaning from medication after this. Briefly discussed weaning process.  Discussed contraception options as she notes she would like to use something in interim until vasectomy is scheduled. Discussed non-hormonal options as this is patient's preference. Notes that she will consider use of the Paraguard IUD. Given handout. To schedule in 3 weeks for insertion if desired, with next cycle. Also given handout on vasectomy to give to partner.    A total of 20 minutes were spent face-to-face with the patient during this encounter and over half of that time involved counseling and coordination of care.   Rubie Maid, MD [facility]

## 2022-01-13 ENCOUNTER — Encounter: Payer: BC Managed Care – PPO | Admitting: Obstetrics and Gynecology

## 2022-01-18 ENCOUNTER — Encounter: Payer: BC Managed Care – PPO | Admitting: Obstetrics and Gynecology

## 2022-02-03 ENCOUNTER — Encounter: Payer: BC Managed Care – PPO | Admitting: Obstetrics and Gynecology

## 2022-02-15 ENCOUNTER — Ambulatory Visit (INDEPENDENT_AMBULATORY_CARE_PROVIDER_SITE_OTHER): Payer: BC Managed Care – PPO | Admitting: Obstetrics and Gynecology

## 2022-02-15 ENCOUNTER — Encounter: Payer: Self-pay | Admitting: Obstetrics and Gynecology

## 2022-02-15 ENCOUNTER — Other Ambulatory Visit: Payer: Self-pay

## 2022-02-15 VITALS — BP 101/50 | HR 71 | Resp 16 | Ht 69.0 in | Wt 228.2 lb

## 2022-02-15 DIAGNOSIS — Z30430 Encounter for insertion of intrauterine contraceptive device: Secondary | ICD-10-CM | POA: Diagnosis not present

## 2022-02-15 LAB — POCT URINE PREGNANCY: Preg Test, Ur: NEGATIVE

## 2022-02-15 NOTE — Progress Notes (Signed)
? ? ? ?  GYNECOLOGY OFFICE PROCEDURE NOTE ? ?DANALI MARINOS is a 36 y.o. [id number] here for Paraguard IUD insertion. No GYN concerns.  Last pap smear was on 08/17/2021 and was normal. ? ?IUD Insertion Procedure Note ?Patient identified, informed consent performed, consent signed.   Discussed risks of irregular bleeding, cramping, infection, malpositioning or misplacement of the IUD outside the uterus which may require further procedure such as laparoscopy. Also discussed >99% contraception efficacy, increased risk of ectopic pregnancy with failure of method.   Emphasized that this did not protect against STIs, condoms recommended during all sexual encounters. Time out was performed.  Urine pregnancy test negative. ? ?Speculum placed in the vagina.  Cervix visualized.  Cleaned with Betadine x 2.  Grasped anteriorly with a single tooth tenaculum.  Uterus sounded to 8 cm.  Paraguard IUD placed per manufacturer's recommendations.  Strings trimmed to 3 cm. Tenaculum was removed, good hemostasis noted.  Patient tolerated procedure well.  ? ?Patient was given post-procedure instructions.  She was advised to have backup contraception for one week.  Patient was also asked to check IUD strings periodically and follow up in 4 weeks for IUD check. ? ? ?Lot: [id number] ?Exp: July 2028 ? ?Hildred Laser, MD ?[facility]  ?

## 2022-02-24 ENCOUNTER — Other Ambulatory Visit: Payer: Self-pay | Admitting: Obstetrics and Gynecology

## 2022-02-24 NOTE — Telephone Encounter (Signed)
LM for patient to confirm medication.  ?

## 2022-03-15 ENCOUNTER — Ambulatory Visit (INDEPENDENT_AMBULATORY_CARE_PROVIDER_SITE_OTHER): Payer: BC Managed Care – PPO | Admitting: Obstetrics and Gynecology

## 2022-03-15 ENCOUNTER — Encounter: Payer: Self-pay | Admitting: Obstetrics and Gynecology

## 2022-03-15 VITALS — BP 121/84 | HR 97 | Resp 16 | Ht 69.0 in | Wt 219.4 lb

## 2022-03-15 DIAGNOSIS — Z30431 Encounter for routine checking of intrauterine contraceptive device: Secondary | ICD-10-CM

## 2022-03-15 NOTE — Progress Notes (Signed)
? ? ?  GYNECOLOGY OFFICE ENCOUNTER NOTE ? ?History:  ?36 y.o. [id number] here today for today for IUD string check; Paragard  IUD was placed  02/15/2022. No complaints about the IUD, no concerning side effects. Does note that partner can feel the strings. ? ?The following portions of the patient's history were reviewed and updated as appropriate: allergies, current medications, past family history, past medical history, past social history, past surgical history and problem list. Last pap smear on 08/17/2021 was normal, negative HRHPV. ? ?Review of Systems:  ?Pertinent items are noted in HPI. ? ?Objective:  ?Physical Exam ?Blood pressure 121/84, pulse 97, resp. rate 16, height 5\' 9"  (1.753 m), weight 219 lb 6.4 oz (99.5 kg), not currently breastfeeding.  ?CONSTITUTIONAL: Well-developed, well-nourished female in no acute distress.  ?NEUROLOGIC: Alert and oriented to person, place, and time. Normal reflexes, muscle tone coordination.  ?ABDOMEN: Soft, no distention noted.   ?PELVIC: Normal appearing external genitalia; normal appearing vaginal mucosa and cervix.  IUD strings visualized, about 4 cm in length outside cervix.  ?EXTREMITIES: Non-tender, no edema or cyanosis ? ?Assessment & Plan:  ?Patient to keep IUD in place for up to 10 years; can come in for removal if she desires pregnancy earlier or for any concerning side effects. ? ? ? ?Rubie Maid, MD ?[facility] ? ?

## 2022-07-04 NOTE — Progress Notes (Signed)
GYNECOLOGY ANNUAL PHYSICAL EXAM PROGRESS NOTE  Subjective:    Anne Clark is a 36 y.o. G81P1023 female who presents for an annual exam. The patient is sexually active. The patient participates in regular exercise: yes. Has the patient ever been transfused or tattooed?: not asked. The patient reports that there is not domestic violence in her life.   The patient has the following complaints today:  Reports premenstrual spotting 3-4 days before her cycle.  Reports cycle is now much heavier, using 2 super pads every 2 hours for the first 2-3 days.  Husband also still noting he can still feel the strings despite recently being trimmed.  Also notes that she has seen a recent class action lawsuit about the ParaGard. Desires removal of her IUD.  Has concerns for weight.  Has used Phentermine in the past which helped. Notes she is exercising some.  Trying to watch what she eats.   Menstrual History: Menarche age: 50 Patient's last menstrual period was 06/29/2022 (exact date). Period Pattern: (!) Irregular Menstrual Flow: Light, Heavy Dysmenorrhea: (!) Severe Dysmenorrhea Symptoms: Cramping   Gynecologic History:  Contraception: IUD History of STI's: Denies Last Pap: 08/17/2021. Results were: normal.  Denies abnormal pap smears.   Upstream - 07/05/22 1441       Pregnancy Intention Screening   Does the patient want to become pregnant in the next year? No    Does the patient's partner want to become pregnant in the next year? No    Would the patient like to discuss contraceptive options today? No      Contraception Wrap Up   Current Method IUD or IUS    End Method IUD or IUS            The pregnancy intention screening data noted above was reviewed. Potential methods of contraception were discussed. The patient elected to proceed with IUD or IUS.   OB History  Gravida Para Term Preterm AB Living  5 3 1  0 2 3  SAB IAB Ectopic Multiple Live Births  2 0 0 0 3    # Outcome  Date GA Lbr Len/2nd Weight Sex Delivery Anes PTL Lv  5 Term 05/18/21 [redacted]w[redacted]d 09:20 / 00:10 7 lb 10.1 oz (3.46 kg) M Vag-Spont Other  LIV     Name: JARAYA, RASBURY     Apgar1: 8  Apgar5: 9  4 Para 03/12/19 [redacted]w[redacted]d  6 lb 8 oz (2.948 kg) F  None N LIV  3 Para 04/02/17 [redacted]w[redacted]d  7 lb 2 oz (3.232 kg) F Vag-Spont Other N LIV  2 SAB 2015 [redacted]w[redacted]d         1 SAB 2013 [redacted]w[redacted]d            Complications: History of D&C    Past Medical History:  Diagnosis Date   Anemia     Past Surgical History:  Procedure Laterality Date   DILATION AND CURETTAGE OF UTERUS     DILATION AND EVACUATION  05/20/2012   missed ab    Family History  Problem Relation Age of Onset   Diabetes Father    Hypertension Father    Breast cancer Paternal Aunt     Social History   Socioeconomic History   Marital status: Married    Spouse name: Abijah   Number of children: Not on file   Years of education: Not on file   Highest education level: Not on file  Occupational History   Not on file  Tobacco  Use   Smoking status: Never   Smokeless tobacco: Never  Vaping Use   Vaping Use: Never used  Substance and Sexual Activity   Alcohol use: Not Currently    Alcohol/week: 1.0 standard drink of alcohol    Types: 1 Standard drinks or equivalent per week   Drug use: No   Sexual activity: Yes    Partners: Male    Birth control/protection: None, Surgical    Comment: planning  Other Topics Concern   Not on file  Social History Narrative   Not on file   Social Determinants of Health   Financial Resource Strain: Not on file  Food Insecurity: Not on file  Transportation Needs: Not on file  Physical Activity: Not on file  Stress: Not on file  Social Connections: Not on file  Intimate Partner Violence: Not on file    Current Outpatient Medications on File Prior to Visit  Medication Sig Dispense Refill   Calcium-Magnesium-Vitamin D (ONE-A-DAY CALCIUM PLUS PO) Take by mouth.     cyanocobalamin 100 MCG tablet 500 mcg.      paragard intrauterine copper IUD IUD 1 each by Intrauterine route once.     No current facility-administered medications on file prior to visit.    No Known Allergies   Review of Systems Constitutional: negative for chills, fatigue, fevers and sweats Eyes: negative for irritation, redness and visual disturbance Ears, nose, mouth, throat, and face: negative for hearing loss, nasal congestion, snoring and tinnitus Respiratory: negative for asthma, cough, sputum Cardiovascular: negative for chest pain, dyspnea, exertional chest pressure/discomfort, irregular heart beat, palpitations and syncope Gastrointestinal: negative for abdominal pain, change in bowel habits, nausea and vomiting Genitourinary: positive for heavy menstrual periods (see HPI).  Negative genital lesions, sexual problems and vaginal discharge, dysuria and urinary incontinence Integument/breast: negative for breast lump, breast tenderness and nipple discharge Hematologic/lymphatic: negative for bleeding and easy bruising Musculoskeletal:negative for back pain and muscle weakness Neurological: negative for dizziness, headaches, vertigo and weakness Endocrine: negative for diabetic symptoms including polydipsia, polyuria and skin dryness Allergic/Immunologic: negative for hay fever and urticaria      Objective:  Blood pressure 132/90, pulse 74, resp. rate 16, height 5\' 9"  (1.753 m), weight 226 lb 4.8 oz (102.6 kg), last menstrual period 06/29/2022, SpO2 96 %, not currently breastfeeding.  Body mass index is 33.42 kg/m.    General Appearance:    Alert, cooperative, no distress, appears stated age, mild obesity  Head:    Normocephalic, without obvious abnormality, atraumatic  Eyes:    PERRL, conjunctiva/corneas clear, EOM's intact, both eyes  Ears:    Normal external ear canals, both ears  Nose:   Nares normal, septum midline, mucosa normal, no drainage or sinus tenderness  Throat:   Lips, mucosa, and tongue normal; teeth  and gums normal  Neck:   Supple, symmetrical, trachea midline, no adenopathy; thyroid: no enlargement/tenderness/nodules; no carotid bruit or JVD  Back:     Symmetric, no curvature, ROM normal, no CVA tenderness  Lungs:     Clear to auscultation bilaterally, respirations unlabored  Chest Wall:    No tenderness or deformity   Heart:    Regular rate and rhythm, S1 and S2 normal, no murmur, rub or gallop  Breast Exam:    No tenderness, masses, or nipple abnormality  Abdomen:     Soft, non-tender, bowel sounds active all four quadrants, no masses, no organomegaly.    Genitalia:    Pelvic:external genitalia normal, vagina without lesions, discharge, or tenderness,  rectovaginal septum  normal. Cervix normal in appearance, no cervical motion tenderness, IUD threads visible 2 cm in length. No adnexal masses or tenderness.  Uterus normal size, shape, mobile, regular contours, nontender.  Rectal:    Normal external sphincter.  No hemorrhoids appreciated. Internal exam not done.   Extremities:   Extremities normal, atraumatic, no cyanosis or edema  Pulses:   2+ and symmetric all extremities  Skin:   Skin color, texture, turgor normal, no rashes or lesions  Lymph nodes:   Cervical, supraclavicular, and axillary nodes normal  Neurologic:   CNII-XII intact, normal strength, sensation and reflexes throughout   .  Labs:  Lab Results  Component Value Date   WBC 14.2 (H) 05/19/2021   HGB 10.9 (L) 05/19/2021   HCT 32.6 (L) 05/19/2021   MCV 80.9 05/19/2021   PLT 321 05/19/2021    Lab Results  Component Value Date   CREATININE 0.67 08/17/2021   BUN 8 08/17/2021   NA 139 08/17/2021   K 4.2 08/17/2021   CL 100 08/17/2021   CO2 24 08/17/2021    Lab Results  Component Value Date   ALT 26 08/17/2021   AST 20 08/17/2021   ALKPHOS 66 08/17/2021   BILITOT 0.3 08/17/2021    Lab Results  Component Value Date   TSH 0.581 12/30/2020     Assessment:   1. Well woman exam with routine gynecological  exam   2. Encounter for counseling regarding contraception   3. IUD (intrauterine device) in place   4. Menorrhagia with regular cycle   5. Obesity (BMI 30.0-34.9)      Plan:  - Blood tests: None ordered, up to date. Due in September. - Breast self exam technique reviewed and patient encouraged to perform self-exam monthly. - Contraception: IUD. Initially desired removal today. Discussed contraceptive desires after removal, patient notes that she desires permanent sterility. Had plans for husband to get vasectomy but to date has not scheduled consultation Thinks she would like to plan for permanent sterilization with tubal ligation.  Will return in 2-3 weeks for further discussion and possible pre-op (tentatively schedule surgery for 8/21).  Will retain IUD until surgery.  Threads cut to level of cervical os  - Discussed healthy lifestyle modifications. - Mammogram  screenings to begin at age 61. - Pap smear  up to date . - Difficulties with weight loss, can consider resumption of Phentermine after surgical intervention.  - Follow up in 1 year for annual exam   Hildred Laser, MD [facility]

## 2022-07-05 ENCOUNTER — Encounter: Payer: Self-pay | Admitting: Obstetrics and Gynecology

## 2022-07-05 ENCOUNTER — Ambulatory Visit (INDEPENDENT_AMBULATORY_CARE_PROVIDER_SITE_OTHER): Payer: BC Managed Care – PPO | Admitting: Obstetrics and Gynecology

## 2022-07-05 VITALS — BP 132/90 | HR 74 | Resp 16 | Ht 69.0 in | Wt 226.3 lb

## 2022-07-05 DIAGNOSIS — Z0000 Encounter for general adult medical examination without abnormal findings: Secondary | ICD-10-CM | POA: Diagnosis not present

## 2022-07-05 DIAGNOSIS — Z01419 Encounter for gynecological examination (general) (routine) without abnormal findings: Secondary | ICD-10-CM | POA: Diagnosis not present

## 2022-07-05 DIAGNOSIS — N920 Excessive and frequent menstruation with regular cycle: Secondary | ICD-10-CM

## 2022-07-05 DIAGNOSIS — Z3009 Encounter for other general counseling and advice on contraception: Secondary | ICD-10-CM | POA: Diagnosis not present

## 2022-07-05 DIAGNOSIS — Z975 Presence of (intrauterine) contraceptive device: Secondary | ICD-10-CM

## 2022-07-05 DIAGNOSIS — E669 Obesity, unspecified: Secondary | ICD-10-CM

## 2022-07-05 NOTE — Progress Notes (Incomplete)
GYNECOLOGY ANNUAL PHYSICAL EXAM PROGRESS NOTE  Subjective:    Anne Clark is a 36 y.o. G51P1023 female who presents for an annual exam. The patient is sexually active. The patient participates in regular exercise: yes. Has the patient ever been transfused or tattooed?: not asked. The patient reports that there is not domestic violence in her life.   The patient has the following complaints today:  Reports premenstrual spotting 3-4 days before her cycle.  Reports cycle is now much heavier, using 2 super pads every 2 hours for the first 2-3 days.  Husband also still noting he can still feel the strings despite recently being trimmed.  Also notes that she has seen a recent class action lawsuit about the ParaGard. Desires removal of her IUD.  Has concerns for weight.  Has used Phentermine in the past which helped. Notes she is exercising some.  Trying to watch what she eats.   Menstrual History: Menarche age: 3 Patient's last menstrual period was 06/29/2022 (exact date). Period Pattern: (!) Irregular Menstrual Flow: Light, Heavy Dysmenorrhea: (!) Severe Dysmenorrhea Symptoms: Cramping   Gynecologic History:  Contraception: IUD History of STI's: Denies Last Pap: 08/17/2021. Results were: normal.  Denies abnormal pap smears.   Upstream - 07/05/22 1441       Pregnancy Intention Screening   Does the patient want to become pregnant in the next year? No    Does the patient's partner want to become pregnant in the next year? No    Would the patient like to discuss contraceptive options today? No      Contraception Wrap Up   Current Method IUD or IUS    End Method IUD or IUS            The pregnancy intention screening data noted above was reviewed. Potential methods of contraception were discussed. The patient elected to proceed with IUD or IUS.   OB History  Gravida Para Term Preterm AB Living  5 3 1  0 2 3  SAB IAB Ectopic Multiple Live Births  2 0 0 0 3    # Outcome  Date GA Lbr Len/2nd Weight Sex Delivery Anes PTL Lv  5 Term 05/18/21 [redacted]w[redacted]d 09:20 / 00:10 7 lb 10.1 oz (3.46 kg) M Vag-Spont Other  LIV     Name: WOODROW, DULSKI     Apgar1: 8  Apgar5: 9  4 Para 03/12/19 [redacted]w[redacted]d  6 lb 8 oz (2.948 kg) F  None N LIV  3 Para 04/02/17 [redacted]w[redacted]d  7 lb 2 oz (3.232 kg) F Vag-Spont Other N LIV  2 SAB 2015 [redacted]w[redacted]d         1 SAB 2013 [redacted]w[redacted]d            Complications: History of D&C    Past Medical History:  Diagnosis Date  . Anemia     Past Surgical History:  Procedure Laterality Date  . DILATION AND CURETTAGE OF UTERUS    . DILATION AND EVACUATION  05/20/2012   missed ab    Family History  Problem Relation Age of Onset  . Diabetes Father   . Hypertension Father   . Breast cancer Paternal Aunt     Social History   Socioeconomic History  . Marital status: Married    Spouse name: Abijah  . Number of children: Not on file  . Years of education: Not on file  . Highest education level: Not on file  Occupational History  . Not on file  Tobacco  Use  . Smoking status: Never  . Smokeless tobacco: Never  Vaping Use  . Vaping Use: Never used  Substance and Sexual Activity  . Alcohol use: Not Currently    Alcohol/week: 1.0 standard drink of alcohol    Types: 1 Standard drinks or equivalent per week  . Drug use: No  . Sexual activity: Yes    Partners: Male    Birth control/protection: None, Surgical    Comment: planning  Other Topics Concern  . Not on file  Social History Narrative  . Not on file   Social Determinants of Health   Financial Resource Strain: Not on file  Food Insecurity: Not on file  Transportation Needs: Not on file  Physical Activity: Not on file  Stress: Not on file  Social Connections: Not on file  Intimate Partner Violence: Not on file    Current Outpatient Medications on File Prior to Visit  Medication Sig Dispense Refill  . Calcium-Magnesium-Vitamin D (ONE-A-DAY CALCIUM PLUS PO) Take by mouth.    . cyanocobalamin 100 MCG  tablet 500 mcg.    . paragard intrauterine copper IUD IUD 1 each by Intrauterine route once.     No current facility-administered medications on file prior to visit.    No Known Allergies   Review of Systems Constitutional: negative for chills, fatigue, fevers and sweats Eyes: negative for irritation, redness and visual disturbance Ears, nose, mouth, throat, and face: negative for hearing loss, nasal congestion, snoring and tinnitus Respiratory: negative for asthma, cough, sputum Cardiovascular: negative for chest pain, dyspnea, exertional chest pressure/discomfort, irregular heart beat, palpitations and syncope Gastrointestinal: negative for abdominal pain, change in bowel habits, nausea and vomiting Genitourinary: positive for heavy menstrual periods (see HPI).  Negative genital lesions, sexual problems and vaginal discharge, dysuria and urinary incontinence Integument/breast: negative for breast lump, breast tenderness and nipple discharge Hematologic/lymphatic: negative for bleeding and easy bruising Musculoskeletal:negative for back pain and muscle weakness Neurological: negative for dizziness, headaches, vertigo and weakness Endocrine: negative for diabetic symptoms including polydipsia, polyuria and skin dryness Allergic/Immunologic: negative for hay fever and urticaria      Objective:  Blood pressure 132/90, pulse 74, resp. rate 16, height 5\' 9"  (1.753 m), weight 226 lb 4.8 oz (102.6 kg), last menstrual period 06/29/2022, SpO2 96 %, not currently breastfeeding.  Body mass index is 33.42 kg/m.    General Appearance:    Alert, cooperative, no distress, appears stated age  Head:    Normocephalic, without obvious abnormality, atraumatic  Eyes:    PERRL, conjunctiva/corneas clear, EOM's intact, both eyes  Ears:    Normal external ear canals, both ears  Nose:   Nares normal, septum midline, mucosa normal, no drainage or sinus tenderness  Throat:   Lips, mucosa, and tongue normal;  teeth and gums normal  Neck:   Supple, symmetrical, trachea midline, no adenopathy; thyroid: no enlargement/tenderness/nodules; no carotid bruit or JVD  Back:     Symmetric, no curvature, ROM normal, no CVA tenderness  Lungs:     Clear to auscultation bilaterally, respirations unlabored  Chest Wall:    No tenderness or deformity   Heart:    Regular rate and rhythm, S1 and S2 normal, no murmur, rub or gallop  Breast Exam:    No tenderness, masses, or nipple abnormality  Abdomen:     Soft, non-tender, bowel sounds active all four quadrants, no masses, no organomegaly.    Genitalia:    Pelvic:external genitalia normal, vagina without lesions, discharge, or tenderness, rectovaginal septum  normal. Cervix normal in appearance, no cervical motion tenderness, IUD threads visible 2 cm in length. No adnexal masses or tenderness.  Uterus normal size, shape, mobile, regular contours, nontender.  Rectal:    Normal external sphincter.  No hemorrhoids appreciated. Internal exam not done.   Extremities:   Extremities normal, atraumatic, no cyanosis or edema  Pulses:   2+ and symmetric all extremities  Skin:   Skin color, texture, turgor normal, no rashes or lesions  Lymph nodes:   Cervical, supraclavicular, and axillary nodes normal  Neurologic:   CNII-XII intact, normal strength, sensation and reflexes throughout   .  Labs:  Lab Results  Component Value Date   WBC 14.2 (H) 05/19/2021   HGB 10.9 (L) 05/19/2021   HCT 32.6 (L) 05/19/2021   MCV 80.9 05/19/2021   PLT 321 05/19/2021    Lab Results  Component Value Date   CREATININE 0.67 08/17/2021   BUN 8 08/17/2021   NA 139 08/17/2021   K 4.2 08/17/2021   CL 100 08/17/2021   CO2 24 08/17/2021    Lab Results  Component Value Date   ALT 26 08/17/2021   AST 20 08/17/2021   ALKPHOS 66 08/17/2021   BILITOT 0.3 08/17/2021    Lab Results  Component Value Date   TSH 0.581 12/30/2020     Assessment:   1. Well woman exam with routine  gynecological exam   2. Encounter for counseling regarding contraception   3. IUD (intrauterine device) in place   4. Menorrhagia with regular cycle      Plan:  - Blood tests: None ordered, up to date. Due in September. - Breast self exam technique reviewed and patient encouraged to perform self-exam monthly. - Contraception: IUD. Initially desired removal today. Discussed contraceptive desires after removal, patient notes that she desires permanent sterility. Had plans for husband to get vasectomy but to date has not scheduled consultation Thinks she would like to plan for permanent sterilization with tubal ligation.  Will return in 2-3 weeks for further discussion and possible pre-op.  Will retain IUD until surgery.  Threads cut to level of cervical os  - Discussed healthy lifestyle modifications. - Mammogram {discussed/ordered:14545} - Pap smear  up to date . Follow up in 1 year for annual exam   Fonda Kinder, CMA [facility]

## 2022-07-06 ENCOUNTER — Encounter: Payer: Self-pay | Admitting: Obstetrics and Gynecology

## 2022-07-24 NOTE — Progress Notes (Unsigned)
GYNECOLOGY PREOPERATIVE HISTORY AND PHYSICAL   Subjective:  Anne Clark is a 36 y.o. [id number] here for surgical management of undesired fertility.  Currently has Paraguard in place but is causing significant bleeding and desires removal. No significant preoperative concerns. She would like to have her Paraguard IUD removed today, instead of when she has surgery.   Proposed surgery: LAPAROSCOPIC TUBAL LIGATION    Pertinent Gynecological History: Menses: Reports premenstrual spotting 3-4 days before her cycle.  Reports cycle is now much heavier, using 2 super pads every 2 hours for the first 2-3 days. Bleeding: intermenstrual bleeding Contraception: IUD Last mammogram:  Not age appropriate:   Last pap: normal Date: 08/17/2021    Past Medical History:  Diagnosis Date   Anemia    Past Surgical History:  Procedure Laterality Date   DILATION AND CURETTAGE OF UTERUS     DILATION AND EVACUATION  05/20/2012   missed ab   OB History  Gravida Para Term Preterm AB Living  5 3 1   2 3   SAB IAB Ectopic Multiple Live Births  2     0 3    # Outcome Date GA Lbr Len/2nd Weight Sex Delivery Anes PTL Lv  5 Term 05/18/21 [redacted]w[redacted]d 09:20 / 00:10 7 lb 10.1 oz (3.46 kg) M Vag-Spont Other  LIV  4 Para 03/12/19 [redacted]w[redacted]d  6 lb 8 oz (2.948 kg) F  None N LIV  3 Para 04/02/17 [redacted]w[redacted]d  7 lb 2 oz (3.232 kg) F Vag-Spont Other N LIV  2 SAB 2015 [redacted]w[redacted]d         1 SAB 2013 [redacted]w[redacted]d            Complications: History of D&C  Patient denies any other pertinent gynecologic issues.  Family History  Problem Relation Age of Onset   Diabetes Father    Hypertension Father    Breast cancer Paternal Aunt    Social History   Socioeconomic History   Marital status: Married    Spouse name: Abijah   Number of children: Not on file   Years of education: Not on file   Highest education level: Not on file  Occupational History   Not on file  Tobacco Use   Smoking status: Never   Smokeless tobacco: Never  Vaping  Use   Vaping Use: Never used  Substance and Sexual Activity   Alcohol use: Not Currently    Alcohol/week: 1.0 standard drink of alcohol    Types: 1 Standard drinks or equivalent per week   Drug use: No   Sexual activity: Yes    Partners: Male    Birth control/protection: None, Surgical    Comment: planning  Other Topics Concern   Not on file  Social History Narrative   Not on file   Social Determinants of Health   Financial Resource Strain: Not on file  Food Insecurity: Not on file  Transportation Needs: Not on file  Physical Activity: Not on file  Stress: Not on file  Social Connections: Not on file  Intimate Partner Violence: Not on file   Current Outpatient Medications on File Prior to Visit  Medication Sig Dispense Refill   Calcium-Magnesium-Vitamin D (ONE-A-DAY CALCIUM PLUS PO) Take by mouth.     cyanocobalamin 100 MCG tablet 500 mcg.     paragard intrauterine copper IUD IUD 1 each by Intrauterine route once.     No current facility-administered medications on file prior to visit.   No Known Allergies  Review of Systems Constitutional: No recent fever/chills/sweats Respiratory: No recent cough/bronchitis Cardiovascular: No chest pain Gastrointestinal: No recent nausea/vomiting/diarrhea Genitourinary: No UTI symptoms Hematologic/lymphatic:No history of coagulopathy or recent blood thinner use    Objective:   Blood pressure 108/74, pulse 73, resp. rate 16, height 5\' 9"  (1.753 m), weight 223 lb 1.6 oz (101.2 kg), last menstrual period 06/29/2022, not currently breastfeeding. CONSTITUTIONAL: Well-developed, well-nourished female in no acute distress.  HENT:  Normocephalic, atraumatic, External right and left ear normal. Oropharynx is clear and moist EYES: Conjunctivae and EOM are normal. Pupils are equal, round, and reactive to light. No scleral icterus.  NECK: Normal range of motion, supple, no masses SKIN: Skin is warm and dry. No rash noted. Not diaphoretic.  No erythema. No pallor. NEUROLOGIC: Alert and oriented to person, place, and time. Normal reflexes, muscle tone coordination. No cranial nerve deficit noted. PSYCHIATRIC: Normal mood and affect. Normal behavior. Normal judgment and thought content. CARDIOVASCULAR: Normal heart rate noted, regular rhythm RESPIRATORY: Effort and breath sounds normal, no problems with respiration noted ABDOMEN: Soft, nontender, nondistended. PELVIC: Deferred MUSCULOSKELETAL: Normal range of motion. No edema and no tenderness. 2+ distal pulses.    Labs: No results found for this or any previous visit (from the past 336 hour(s)).   Imaging Studies: No results found.  Assessment:    Undesired fertility   Breakthrough bleeding with IUD  Plan:   - Counseling: Patient desires permanent sterilization.  Other reversible forms of contraception were discussed with patient; she declines all other modalities. Risks of procedure discussed with patient including but not limited to: risk of regret, permanence of method, bleeding, infection, injury to surrounding organs and need for additional procedures.  Failure risk of about 1% with increased risk of ectopic gestation if pregnancy occurs was also discussed with patient.  Also discussed possibility of post-tubal pain syndrome. Patient verbalized understanding of these risks and wants to proceed with sterilization.  Written informed consent obtained. - Preop testing ordered. - Instructions reviewed, including NPO after midnight.    GYNECOLOGY OFFICE PROCEDURE NOTE  Anne Clark is a 36 y.o. 31 here for a Paraguard IUD removal. No GYN concerns.  Last pap smear was on 08/17/2021 and was normal.  IUD Removal  Patient identified, informed consent performed, consent signed.  Patient was in the dorsal lithotomy position, normal external genitalia was noted.  A speculum was placed in the patient's vagina, normal discharge was noted, no lesions. The cervix was  visualized, no lesions, no abnormal discharge.  The strings of the IUD were grasped and pulled using ring forceps. (The strings of the IUD were not visualized, so Kelly forceps were introduced into the endometrial cavity and the IUD was grasped and removed in its entirety).  Patient tolerated the procedure well.    Patient plans for permanent sterilization. Will use barrier method until surgery next week.  Routine preventative health maintenance measures emphasized.   10/17/2021, MD [facility]

## 2022-07-24 NOTE — Patient Instructions (Signed)
Laparoscopic Tubal Ligation Laparoscopic tubal ligation is a procedure to close the fallopian tubes. This is done to prevent pregnancy. When the fallopian tubes are closed, the eggs that your ovaries release cannot enter the uterus, and sperm cannot reach the released eggs. You should not have this procedure if you want to get pregnant someday or if you are unsure about having more children. Tell a health care provider about: Any allergies you have. All medicines you are taking, including vitamins, herbs, eye drops, creams, and over-the-counter medicines. Any problems you or family members have had with anesthetic medicines. Any blood disorders you have. Any surgeries you have had. Any medical conditions you have. Whether you are pregnant or may be pregnant. Any past pregnancies. What are the risks? Generally, this is a safe procedure. However, problems may occur, including: Infection. Bleeding. Injury to other organs in the abdomen. Side effects from anesthetic medicines. Failure of the procedure. This procedure can increase your risk of an ectopic pregnancy. This is a pregnancy in which a fertilized egg attaches to the outside of the uterus. What happens before the procedure? Staying hydrated Follow instructions from your health care provider about hydration, which may include: Up to 2 hours before the procedure - you may continue to drink clear liquids, such as water, clear fruit juice, black coffee, and plain tea. Eating and drinking restrictions Follow instructions from your health care provider about eating and drinking, which may include: 8 hours before the procedure - stop eating heavy meals or foods, such as meat, fried foods, or fatty foods. 6 hours before the procedure - stop eating light meals or foods, such as toast or cereal. 6 hours before the procedure - stop drinking milk or drinks that contain milk. 2 hours before the procedure - stop drinking clear  liquids. Medicines Ask your health care provider about: Changing or stopping your regular medicines. This is especially important if you are taking diabetes medicines or blood thinners. Taking medicines such as aspirin and ibuprofen. These medicines can thin your blood. Do not take these medicines unless your health care provider tells you to take them. Taking over-the-counter medicines, vitamins, herbs, and supplements. Surgery safety Ask your health care provider: How your surgery site will be marked. What steps will be taken to help prevent infection. These steps may include: Removing hair at the surgery site. Washing skin with a germ-killing soap. Taking antibiotic medicine. General instructions Do not use any products that contain nicotine or tobacco for at least 4 weeks before the procedure. These products include cigarettes, chewing tobacco, and vaping devices, such as e-cigarettes. If you need help quitting, ask your health care provider. Plan to have someone take you home from the hospital. If you will be going home right after the procedure, plan to have a responsible adult care for you for the time you are told. This is important. What happens during the procedure?     An IV will be inserted into one of your veins. You will be given one or more of the following: A medicine to help you relax (sedative). A medicine to numb the area (local anesthetic). A medicine to make you fall asleep (general anesthetic). A medicine that is injected into an area of your body to numb everything below the injection site (regional anesthetic). Your bladder may be emptied with a small tube (catheter). If you have been given a general anesthetic, a tube will be put down your throat to help you breathe. Two small incisions will   be made in your lower abdomen and near your belly button. Your abdomen will be inflated with a gas. This will let the surgeon see better and will give the surgeon room to  work. A lighted tube with camera (laparoscope) will be inserted into your abdomen through one of the incisions. Small instruments will be inserted through the other incision. The fallopian tubes will be tied off, burned (cauterized), or blocked with a clip, ring, or clamp. A small portion in the center of each fallopian tube may be removed. The gas will be released from the abdomen. The incisions will be closed with stitches (sutures). A bandage (dressing) will be placed over the incisions. The procedure may vary among health care providers and hospitals. What happens after the procedure? Your blood pressure, heart rate, breathing rate, and blood oxygen level will be monitored until you leave the hospital. You will be given medicine to help with pain, nausea, and vomiting as needed. You may have vaginal discharge after the procedure. You may need to wear a sanitary napkin. If you were given a sedative during the procedure, it can affect you for several hours. Do not drive or operate machinery until your health care provider says that it is safe. Summary Laparoscopic tubal ligation is a procedure that is done to prevent pregnancy. You should not have this procedure if you want to get pregnant someday or if you are unsure about having more children. The procedure is done using a thin, lighted tube (laparoscope) with a camera attached that will be inserted into your abdomen through an incision. After the procedure you will be given medicine to help with pain, nausea, and vomiting as needed. Plan to have someone take you home from the hospital. This information is not intended to replace advice given to you by your health care provider. Make sure you discuss any questions you have with your health care provider. Document Revised: 08/13/2020 Document Reviewed: 08/13/2020 Elsevier Patient Education  2023 [employer]  

## 2022-07-25 ENCOUNTER — Ambulatory Visit (INDEPENDENT_AMBULATORY_CARE_PROVIDER_SITE_OTHER): Payer: BC Managed Care – PPO | Admitting: Obstetrics and Gynecology

## 2022-07-25 ENCOUNTER — Encounter: Payer: Self-pay | Admitting: Obstetrics and Gynecology

## 2022-07-25 VITALS — BP 108/74 | HR 73 | Resp 16 | Ht 69.0 in | Wt 223.1 lb

## 2022-07-25 DIAGNOSIS — Z01818 Encounter for other preprocedural examination: Secondary | ICD-10-CM | POA: Diagnosis not present

## 2022-07-25 DIAGNOSIS — Z3009 Encounter for other general counseling and advice on contraception: Secondary | ICD-10-CM

## 2022-07-26 ENCOUNTER — Encounter
Admission: RE | Admit: 2022-07-26 | Discharge: 2022-07-26 | Disposition: A | Discharge status: Home / Self Care | Payer: BC Managed Care – PPO | Source: Ambulatory Visit | Attending: Obstetrics and Gynecology | Admitting: Obstetrics and Gynecology

## 2022-07-26 ENCOUNTER — Encounter: Payer: Self-pay | Admitting: Urgent Care

## 2022-07-26 VITALS — Ht 69.0 in | Wt 223.0 lb

## 2022-07-26 DIAGNOSIS — Z3009 Encounter for other general counseling and advice on contraception: Secondary | ICD-10-CM | POA: Diagnosis not present

## 2022-07-26 DIAGNOSIS — Z01818 Encounter for other preprocedural examination: Secondary | ICD-10-CM

## 2022-07-26 DIAGNOSIS — Z01812 Encounter for preprocedural laboratory examination: Secondary | ICD-10-CM | POA: Diagnosis present

## 2022-07-26 LAB — CBC
HCT: 38.2 % (ref 36.0–46.0)
Hemoglobin: 12.6 g/dL (ref 12.0–15.0)
MCH: 27.4 pg (ref 26.0–34.0)
MCHC: 33 g/dL (ref 30.0–36.0)
MCV: 83 fL (ref 80.0–100.0)
Platelets: 348 10*3/uL (ref 150–400)
RBC: 4.6 MIL/uL (ref 3.87–5.11)
RDW: 12.2 % (ref 11.5–15.5)
WBC: 7.5 10*3/uL (ref 4.0–10.5)
nRBC: 0 % (ref 0.0–0.2)

## 2022-07-26 NOTE — Patient Instructions (Signed)
Your procedure is scheduled on: 07/31/22 Report to DAY SURGERY DEPARTMENT LOCATED ON 2ND FLOOR MEDICAL MALL ENTRANCE. To find out your arrival time please call [phone] between 1PM - 3PM on 07/28/22.  Remember: Instructions that are not followed completely may result in serious medical risk, up to and including death, or upon the discretion of your surgeon and anesthesiologist your surgery may need to be rescheduled.     _X__ 1. Do not eat food or drink any liquids after midnight the night before your procedure.                 No gum chewing or hard candies.   __X__2.  On the morning of surgery brush your teeth with toothpaste and water, you                 may rinse your mouth with mouthwash if you wish.  Do not swallow any              toothpaste of mouthwash.     _X__ 3.  No Alcohol for 24 hours before or after surgery.   _X__ 4.  Do Not Smoke or use e-cigarettes For 24 Hours Prior to Your Surgery.                 Do not use any chewable tobacco products for at least 6 hours prior to                 surgery.  ____  5.  Bring all medications with you on the day of surgery if instructed.   __X__  6.  Notify your doctor if there is any change in your medical condition      (cold, fever, infections).     Do not wear jewelry, make-up, hairpins, clips or nail polish. Do not wear lotions, powders, or perfumes. You may wear deodorant Do not shave body hair 48 hours prior to surgery. Men may shave face and neck. Do not bring valuables to the hospital.    [facility] is not responsible for any belongings or valuables.  Contacts, dentures/partials or body piercings may not be worn into surgery. Bring a case for your contacts, glasses or hearing aids, a denture cup will be supplied. Leave your suitcase in the car. After surgery it may be brought to your room. For patients admitted to the hospital, discharge time is determined by your treatment team.   Patients discharged the day of  surgery will not be allowed to drive home.   Please read over the following fact sheets that you were given:   CHG soap  __X__ Take these medicines the morning of surgery with A SIP OF WATER:    1. none  2.   3.   4.  5.  6.  ____ Fleet Enema (as directed)   __X__ Use CHG Soap/SAGE wipes as directed  ____ Use inhalers on the day of surgery  ____ Stop metformin/Janumet/Farxiga 2 days prior to surgery    ____ Take 1/2 of usual insulin dose the night before surgery. No insulin the morning          of surgery.   ____ Stop Blood Thinners Coumadin/Plavix/Xarelto/Pleta/Pradaxa/Eliquis/Effient/Aspirin  on   Or contact your Surgeon, Cardiologist or Medical Doctor regarding  ability to stop your blood thinners  __X__ Stop Anti-inflammatories 7 days before surgery such as Advil, Ibuprofen, Motrin,  BC or Goodies Powder, Naprosyn, Naproxen, Aleve, Aspirin    __X__ Stop all herbals and  supplements, fish oil or vitamins  until after surgery.    ____ Bring C-Pap to the hospital.

## 2022-07-30 MED ORDER — LACTATED RINGERS IV SOLN: INTRAVENOUS | Status: DC

## 2022-07-30 MED ORDER — CELECOXIB 200 MG PO CAPS
400.0000 mg | ORAL_CAPSULE | ORAL | Status: AC
  Administered 2022-07-31: 400 mg via ORAL

## 2022-07-30 MED ORDER — ACETAMINOPHEN 500 MG PO TABS
1000.0000 mg | ORAL_TABLET | ORAL | Status: AC
  Administered 2022-07-31: 1000 mg via ORAL

## 2022-07-30 MED ORDER — CHLORHEXIDINE GLUCONATE 0.12 % MT SOLN: 15.0000 mL | Freq: Once | OROMUCOSAL | Status: AC

## 2022-07-30 MED ORDER — FAMOTIDINE 20 MG PO TABS
20.0000 mg | ORAL_TABLET | Freq: Once | ORAL | Status: AC
  Administered 2022-07-31: 20 mg via ORAL

## 2022-07-30 MED ORDER — GABAPENTIN 300 MG PO CAPS
300.0000 mg | ORAL_CAPSULE | ORAL | Status: AC
  Administered 2022-07-31: 300 mg via ORAL

## 2022-07-30 MED ORDER — ORAL CARE MOUTH RINSE: 15.0000 mL | Freq: Once | OROMUCOSAL | Status: AC

## 2022-07-31 ENCOUNTER — Ambulatory Visit
Admission: RE | Admit: 2022-07-31 | Discharge: 2022-07-31 | Disposition: A | Discharge status: Home / Self Care | Payer: BC Managed Care – PPO | Attending: Obstetrics and Gynecology | Admitting: Obstetrics and Gynecology

## 2022-07-31 ENCOUNTER — Ambulatory Visit: Payer: BC Managed Care – PPO | Admitting: Certified Registered"

## 2022-07-31 ENCOUNTER — Encounter: Payer: Self-pay | Admitting: Obstetrics and Gynecology

## 2022-07-31 ENCOUNTER — Other Ambulatory Visit: Payer: Self-pay

## 2022-07-31 ENCOUNTER — Encounter
Admission: RE | Disposition: A | Discharge status: Home / Self Care | Payer: Self-pay | Source: Home / Self Care | Attending: Obstetrics and Gynecology

## 2022-07-31 DIAGNOSIS — Z01818 Encounter for other preprocedural examination: Secondary | ICD-10-CM

## 2022-07-31 DIAGNOSIS — Z302 Encounter for sterilization: Secondary | ICD-10-CM | POA: Insufficient documentation

## 2022-07-31 DIAGNOSIS — Z3009 Encounter for other general counseling and advice on contraception: Secondary | ICD-10-CM

## 2022-07-31 HISTORY — PX: LAPAROSCOPIC TUBAL LIGATION: SHX1937

## 2022-07-31 LAB — POCT PREGNANCY, URINE: Preg Test, Ur: NEGATIVE

## 2022-07-31 SURGERY — LIGATION, FALLOPIAN TUBE, LAPAROSCOPIC
Anesthesia: General | Laterality: Bilateral

## 2022-07-31 MED ORDER — LIDOCAINE HCL (PF) 2 % IJ SOLN
INTRAMUSCULAR | Status: AC
  Filled 2022-07-31: qty 5

## 2022-07-31 MED ORDER — 0.9 % SODIUM CHLORIDE (POUR BTL) OPTIME
TOPICAL | Status: DC | PRN
  Administered 2022-07-31: 500 mL

## 2022-07-31 MED ORDER — MIDAZOLAM HCL 2 MG/2ML IJ SOLN
INTRAMUSCULAR | Status: DC | PRN
  Administered 2022-07-31: 2 mg via INTRAVENOUS

## 2022-07-31 MED ORDER — FENTANYL CITRATE (PF) 100 MCG/2ML IJ SOLN
INTRAMUSCULAR | Status: AC
  Filled 2022-07-31: qty 2

## 2022-07-31 MED ORDER — DEXAMETHASONE SODIUM PHOSPHATE 10 MG/ML IJ SOLN
INTRAMUSCULAR | Status: DC | PRN
  Administered 2022-07-31: 10 mg via INTRAVENOUS

## 2022-07-31 MED ORDER — OXYCODONE HCL 5 MG PO TABS
ORAL_TABLET | ORAL | Status: AC
  Filled 2022-07-31: qty 1

## 2022-07-31 MED ORDER — CHLORHEXIDINE GLUCONATE 0.12 % MT SOLN
OROMUCOSAL | Status: AC
  Administered 2022-07-31: 15 mL via OROMUCOSAL
  Filled 2022-07-31: qty 15

## 2022-07-31 MED ORDER — ONDANSETRON HCL 4 MG/2ML IJ SOLN
INTRAMUSCULAR | Status: AC
  Filled 2022-07-31: qty 2

## 2022-07-31 MED ORDER — ROCURONIUM BROMIDE 10 MG/ML (PF) SYRINGE
PREFILLED_SYRINGE | INTRAVENOUS | Status: AC
  Filled 2022-07-31: qty 10

## 2022-07-31 MED ORDER — SUGAMMADEX SODIUM 200 MG/2ML IV SOLN
INTRAVENOUS | Status: DC | PRN
  Administered 2022-07-31: 200 mg via INTRAVENOUS

## 2022-07-31 MED ORDER — DEXAMETHASONE SODIUM PHOSPHATE 10 MG/ML IJ SOLN
INTRAMUSCULAR | Status: AC
  Filled 2022-07-31: qty 1

## 2022-07-31 MED ORDER — CELECOXIB 200 MG PO CAPS
ORAL_CAPSULE | ORAL | Status: AC
  Filled 2022-07-31: qty 2

## 2022-07-31 MED ORDER — IBUPROFEN 800 MG PO TABS: 800.0000 mg | ORAL_TABLET | Freq: Three times a day (TID) | ORAL | 0 refills | Status: DC | PRN

## 2022-07-31 MED ORDER — GABAPENTIN 300 MG PO CAPS
ORAL_CAPSULE | ORAL | Status: AC
  Filled 2022-07-31: qty 1

## 2022-07-31 MED ORDER — ONDANSETRON HCL 4 MG/2ML IJ SOLN
INTRAMUSCULAR | Status: DC | PRN
  Administered 2022-07-31: 4 mg via INTRAVENOUS

## 2022-07-31 MED ORDER — PROPOFOL 10 MG/ML IV BOLUS
INTRAVENOUS | Status: AC
  Filled 2022-07-31: qty 40

## 2022-07-31 MED ORDER — FENTANYL CITRATE (PF) 100 MCG/2ML IJ SOLN
INTRAMUSCULAR | Status: DC | PRN
Start: 2022-07-31 — End: 2022-07-31
  Administered 2022-07-31 (×2): 50 ug via INTRAVENOUS

## 2022-07-31 MED ORDER — ACETAMINOPHEN 500 MG PO TABS
ORAL_TABLET | ORAL | Status: AC
  Filled 2022-07-31: qty 2

## 2022-07-31 MED ORDER — BUPIVACAINE HCL (PF) 0.5 % IJ SOLN
INTRAMUSCULAR | Status: AC
Start: 2022-07-31 — End: ?
  Filled 2022-07-31: qty 30

## 2022-07-31 MED ORDER — PROPOFOL 10 MG/ML IV BOLUS
INTRAVENOUS | Status: AC
  Filled 2022-07-31: qty 20

## 2022-07-31 MED ORDER — MIDAZOLAM HCL 2 MG/2ML IJ SOLN
INTRAMUSCULAR | Status: AC
  Filled 2022-07-31: qty 2

## 2022-07-31 MED ORDER — OXYCODONE-ACETAMINOPHEN 5-325 MG PO TABS: 1.0000 | ORAL_TABLET | Freq: Four times a day (QID) | ORAL | 0 refills | Status: DC | PRN

## 2022-07-31 MED ORDER — BUPIVACAINE HCL (PF) 0.5 % IJ SOLN
INTRAMUSCULAR | Status: AC
  Filled 2022-07-31: qty 30

## 2022-07-31 MED ORDER — PROPOFOL 500 MG/50ML IV EMUL
INTRAVENOUS | Status: DC | PRN
  Administered 2022-07-31: 25 ug/kg/min via INTRAVENOUS

## 2022-07-31 MED ORDER — BUPIVACAINE HCL 0.5 % IJ SOLN
INTRAMUSCULAR | Status: DC | PRN
  Administered 2022-07-31: 10 mL

## 2022-07-31 MED ORDER — FENTANYL CITRATE (PF) 100 MCG/2ML IJ SOLN
25.0000 ug | INTRAMUSCULAR | Status: DC | PRN
  Administered 2022-07-31 (×3): 50 ug via INTRAVENOUS

## 2022-07-31 MED ORDER — FAMOTIDINE 20 MG PO TABS
ORAL_TABLET | ORAL | Status: AC
  Filled 2022-07-31: qty 1

## 2022-07-31 MED ORDER — OXYCODONE HCL 5 MG/5ML PO SOLN: 5.0000 mg | Freq: Once | ORAL | Status: AC | PRN

## 2022-07-31 MED ORDER — PROPOFOL 10 MG/ML IV BOLUS
INTRAVENOUS | Status: DC | PRN
  Administered 2022-07-31: 200 mg via INTRAVENOUS

## 2022-07-31 MED ORDER — OXYCODONE HCL 5 MG PO TABS
5.0000 mg | ORAL_TABLET | Freq: Once | ORAL | Status: AC | PRN
  Administered 2022-07-31: 5 mg via ORAL

## 2022-07-31 MED ORDER — LIDOCAINE HCL (CARDIAC) PF 100 MG/5ML IV SOSY
PREFILLED_SYRINGE | INTRAVENOUS | Status: DC | PRN
  Administered 2022-07-31: 80 mg via INTRAVENOUS

## 2022-07-31 MED ORDER — ROCURONIUM BROMIDE 100 MG/10ML IV SOLN
INTRAVENOUS | Status: DC | PRN
  Administered 2022-07-31: 50 mg via INTRAVENOUS

## 2022-07-31 SURGICAL SUPPLY — 32 items
ADH SKN CLS APL DERMABOND .7 (GAUZE/BANDAGES/DRESSINGS) ×1
APL PRP STRL LF DISP 70% ISPRP (MISCELLANEOUS) ×1
BLADE SURG SZ11 CARB STEEL (BLADE) ×1 IMPLANT
CATH ROBINSON RED A/P 16FR (CATHETERS) ×1 IMPLANT
CHLORAPREP W/TINT 26 (MISCELLANEOUS) ×1 IMPLANT
CLIP FILSHIE TUBAL LIGA STRL (Clip) ×1 IMPLANT
DERMABOND ADVANCED (GAUZE/BANDAGES/DRESSINGS) ×1
DERMABOND ADVANCED .7 DNX12 (GAUZE/BANDAGES/DRESSINGS) ×1 IMPLANT
GAUZE 4X4 16PLY ~~LOC~~+RFID DBL (SPONGE) ×1 IMPLANT
GLOVE BIO SURGEON STRL SZ 6.5 (GLOVE) ×1 IMPLANT
GLOVE SURG UNDER LTX SZ7 (GLOVE) ×2 IMPLANT
GOWN STRL REUS W/ TWL LRG LVL3 (GOWN DISPOSABLE) ×2 IMPLANT
GOWN STRL REUS W/TWL LRG LVL3 (GOWN DISPOSABLE) ×2
KIT PINK PAD W/HEAD ARE REST (MISCELLANEOUS) ×1
KIT PINK PAD W/HEAD ARM REST (MISCELLANEOUS) ×1 IMPLANT
KIT TURNOVER CYSTO (KITS) ×1 IMPLANT
LABEL OR SOLS (LABEL) ×1 IMPLANT
MANIFOLD NEPTUNE II (INSTRUMENTS) ×1 IMPLANT
NS IRRIG 500ML POUR BTL (IV SOLUTION) ×1 IMPLANT
PACK GYN LAPAROSCOPIC (MISCELLANEOUS) ×1 IMPLANT
PAD OB MATERNITY 4.3X12.25 (PERSONAL CARE ITEMS) ×1 IMPLANT
PAD PREP 24X41 OB/GYN DISP (PERSONAL CARE ITEMS) ×1 IMPLANT
SCRUB CHG 4% DYNA-HEX 4OZ (MISCELLANEOUS) ×1 IMPLANT
SET TUBE SMOKE EVAC HIGH FLOW (TUBING) ×1 IMPLANT
SHEARS ENDO 5MM LNG  ASK BEFOR (MISCELLANEOUS) ×1
SHEARS ENDO 5MM LNG ASK BEFOR (MISCELLANEOUS) ×1 IMPLANT
SUT VIC AB 4-0 SH 27 (SUTURE) ×1
SUT VIC AB 4-0 SH 27XANBCTRL (SUTURE) ×1 IMPLANT
SUT VICRYL 0 AB UR-6 (SUTURE) ×1 IMPLANT
TRAP FLUID SMOKE EVACUATOR (MISCELLANEOUS) ×1 IMPLANT
TROCAR XCEL 12X100 BLDLESS (ENDOMECHANICALS) ×1 IMPLANT
WATER STERILE IRR 500ML POUR (IV SOLUTION) ×1 IMPLANT

## 2022-07-31 NOTE — Anesthesia Preprocedure Evaluation (Signed)
Anesthesia Evaluation  Patient identified by MRN, date of birth, ID band Patient awake    Reviewed: Allergy & Precautions, NPO status , Patient's Chart, lab work & pertinent test results  Airway Mallampati: I  TM Distance: >3 FB Neck ROM: full    Dental  (+) Teeth Intact   Pulmonary neg pulmonary ROS, neg shortness of breath, neg COPD,    Pulmonary exam normal        Cardiovascular (-) Past MI and (-) CABG negative cardio ROS Normal cardiovascular exam(-) pacemaker     Neuro/Psych PSYCHIATRIC DISORDERS negative neurological ROS     GI/Hepatic negative GI ROS, Neg liver ROS,   Endo/Other  negative endocrine ROS  Renal/GU      Musculoskeletal   Abdominal   Peds  Hematology negative hematology ROS (+)   Anesthesia Other Findings Past Medical History: No date: Anemia 07/27/2021: History of 2019 novel coronavirus disease (COVID-19)  Past Surgical History: 05/20/2012: DILATION AND EVACUATION     Comment:  missed ab     Reproductive/Obstetrics negative OB ROS                             Anesthesia Physical Anesthesia Plan  ASA: 2  Anesthesia Plan: General ETT and General   Post-op Pain Management:    Induction: Intravenous  PONV Risk Score and Plan: 4 or greater and Ondansetron, Dexamethasone and Midazolam  Airway Management Planned: Oral ETT  Additional Equipment:   Intra-op Plan:   Post-operative Plan: Extubation in OR  Informed Consent: I have reviewed the patients History and Physical, chart, labs and discussed the procedure including the risks, benefits and alternatives for the proposed anesthesia with the patient or authorized representative who has indicated his/her understanding and acceptance.     Dental Advisory Given  Plan Discussed with: Anesthesiologist, CRNA and Surgeon  Anesthesia Plan Comments: (Patient consented for risks of anesthesia including but not  limited to:  - adverse reactions to medications - damage to eyes, teeth, lips or other oral mucosa - nerve damage due to positioning  - sore throat or hoarseness - Damage to heart, brain, nerves, lungs, other parts of body or loss of life  Patient voiced understanding.)        Anesthesia Quick Evaluation

## 2022-07-31 NOTE — Interval H&P Note (Signed)
History and Physical Interval Note:  07/31/2022 10:57 AM  Anne Clark  has presented today for surgery, with the diagnosis of Undesired Fertility.  The various methods of treatment have been discussed with the patient and family. After consideration of risks, benefits and other options for treatment, the patient has consented to  Procedure(s): LAPAROSCOPIC TUBAL LIGATION (Bilateral) as a surgical intervention.  The patient's history has been reviewed, patient examined, no change in status, stable for surgery.  I have reviewed the patient's chart and labs.  Questions were answered to the patient's satisfaction.     Hildred Laser, MD

## 2022-07-31 NOTE — Op Note (Signed)
Procedure(s): LAPAROSCOPIC TUBAL LIGATION WITH FILSHIE CLIPS Procedure Note  Anne Clark female 36 y.o. 07/31/2022  Indications: The patient is a 36 y.o. G23P1023 female with undesired fertility  Pre-operative Diagnosis: Undesired fertility  Post-operative Diagnosis: Same  Surgeon: Hildred Laser, MD  Assistants:  None.   Anesthesia: General endotracheal anesthesia  Findings: The uterus was sounded to 7 cm Fallopian tubes and ovaries appeared normal.  Procedure Details: The patient was seen in the Holding Room. The risks, benefits, complications, treatment options, and expected outcomes were discussed with the patient. The patient concurred with the proposed plan, giving informed consent.  The site of surgery properly noted/marked. The patient was taken to the Operating Room, identified as Anne Clark and the procedure verified as Procedure(s) (LRB): LAPAROSCOPIC TUBAL LIGATION WITH FILSHIE CLIPS (Bilateral).  She was then placed under general anesthesia without difficulty. She was placed in the dorsal lithotomy position, and was prepped and draped in a sterile manner.  A Time Out was held and the above information confirmed. A straight catheterization was performed. A sterile speculum was inserted into the vagina and the cervix was grasped at the anterior lip using a single-toothed tenaculum.  The uterus was sounded to 7 cm, and a Hulka clamp was placed for uterine manipulation.  The speculum and tenaculum were then removed. Attention was then turned to the patient's abdomen where a 12-mm skin incision was made in the umbilical fold.  The Optiview 12-mm trocar and sleeve were then advanced without difficulty with the laparoscope under direct visualization into the abdomen.  The abdomen was then insufflated with carbon dioxide gas and adequate pneumoperitoneum was obtained.  A survey of the patient's pelvis and abdomen revealed entirely normal anatomy.  The fallopian tubes were  observed and found to be normal in appearance. The Filshie clip applicator was placed through the operative port, and a Filshie clip was placed on the right fallopian tube about 2-3 cm from the cornual attachment, with care given to incorporate the underlying mesosalpinx.  A similar process was carried out on the contralateral side allowing for bilateral tubal sterilization.   Good hemostasis was noted overall.  The instruments were then removed from the patient's abdomen and the fascial incision was repaired with 0 Vicryl, and the skin was closed with 4-0 Vicryl and Dermabond.  The incision was injected with 10 ml of 0.5% Sensorcaine.   The uterine manipulator was then removed from the vagina without complications. The patient tolerated the procedure well.  Sponge, lap, and needle counts were correct times two.  The patient was then taken to the recovery room awake, extubated and in stable condition.   Estimated Blood Loss:  minimal      Drains: straight catheterization prior to procedure with  20 ml of clear urine         Total IV Fluids:  600 ml  Specimens: None         Implants: None         Complications:  None; patient tolerated the procedure well.         Disposition: PACU - hemodynamically stable.         Condition: stable   Hildred Laser, MD [facility]

## 2022-07-31 NOTE — Transfer of Care (Signed)
Immediate Anesthesia Transfer of Care Note  Patient: Anne Clark  Procedure(s) Performed: LAPAROSCOPIC TUBAL LIGATION WITH FILSHIE CLIPS (Bilateral)  Patient Location: PACU  Anesthesia Type:General  Level of Consciousness: drowsy  Airway & Oxygen Therapy: Patient Spontanous Breathing and Patient connected to face mask oxygen  Post-op Assessment: Report given to RN and Post -op Vital signs reviewed and stable  Post vital signs: Reviewed and stable  Last Vitals:  Vitals Value Taken Time  BP 121/76 07/31/22 1245  Temp 36 1245  Pulse 87 07/31/22 1248  Resp 25 07/31/22 1248  SpO2 100 % 07/31/22 1248  Vitals shown include unvalidated device data.  Last Pain:  Vitals:   07/31/22 1101  TempSrc: Temporal  PainSc: 0-No pain         Complications: No notable events documented.

## 2022-07-31 NOTE — Discharge Instructions (Signed)

## 2022-07-31 NOTE — H&P (View-Only) (Signed)
GYNECOLOGY PREOPERATIVE HISTORY AND PHYSICAL   Subjective:  Anne Clark is a 36 y.o. [id number] here for surgical management of undesired fertility.  Currently has Paraguard in place but is causing significant bleeding and desires removal. No significant preoperative concerns. She would like to have her Paraguard IUD removed today, instead of when she has surgery.   Proposed surgery: LAPAROSCOPIC TUBAL LIGATION    Pertinent Gynecological History: Menses: Reports premenstrual spotting 3-4 days before her cycle.  Reports cycle is now much heavier, using 2 super pads every 2 hours for the first 2-3 days. Bleeding: intermenstrual bleeding Contraception: IUD Last mammogram:  Not age appropriate:   Last pap: normal Date: 08/17/2021    Past Medical History:  Diagnosis Date   Anemia    History of 2019 novel coronavirus disease (COVID-19) 07/27/2021   Past Surgical History:  Procedure Laterality Date   DILATION AND EVACUATION  05/20/2012   missed ab   OB History  Gravida Para Term Preterm AB Living  5 3 1   2 3   SAB IAB Ectopic Multiple Live Births  2     0 3    # Outcome Date GA Lbr Len/2nd Weight Sex Delivery Anes PTL Lv  5 Term 05/18/21 [redacted]w[redacted]d 09:20 / 00:10 7 lb 10.1 oz (3.46 kg) M Vag-Spont Other  LIV  4 Para 03/12/19 [redacted]w[redacted]d  6 lb 8 oz (2.948 kg) F  None N LIV  3 Para 04/02/17 [redacted]w[redacted]d  7 lb 2 oz (3.232 kg) F Vag-Spont Other N LIV  2 SAB 2015 [redacted]w[redacted]d         1 SAB 2013 [redacted]w[redacted]d            Complications: History of D&C  Patient denies any other pertinent gynecologic issues.  Family History  Problem Relation Age of Onset   Diabetes Father    Hypertension Father    Breast cancer Paternal Aunt    Social History   Socioeconomic History   Marital status: Married    Spouse name: Abijah   Number of children: Not on file   Years of education: Not on file   Highest education level: Not on file  Occupational History   Not on file  Tobacco Use   Smoking status: Never    Smokeless tobacco: Never  Vaping Use   Vaping Use: Never used  Substance and Sexual Activity   Alcohol use: Not Currently    Alcohol/week: 1.0 standard drink of alcohol    Types: 1 Standard drinks or equivalent per week   Drug use: No   Sexual activity: Yes    Partners: Male    Birth control/protection: None, Surgical    Comment: planning  Other Topics Concern   Not on file  Social History Narrative   Not on file   Social Determinants of Health   Financial Resource Strain: Not on file  Food Insecurity: Not on file  Transportation Needs: Not on file  Physical Activity: Not on file  Stress: Not on file  Social Connections: Not on file  Intimate Partner Violence: Not on file   Current Facility-Administered Medications on File Prior to Visit  Medication Dose Route Frequency Provider Last Rate Last Admin   acetaminophen (TYLENOL) 500 MG tablet            celecoxib (CELEBREX) 200 MG capsule            famotidine (PEPCID) 20 MG tablet  gabapentin (NEURONTIN) 300 MG capsule            lactated ringers infusion   Intravenous Continuous Hildred Laser, MD       lactated ringers infusion   Intravenous Continuous Reed Breech, MD       Current Outpatient Medications on File Prior to Visit  Medication Sig Dispense Refill   Calcium-Magnesium-Vitamin D (ONE-A-DAY CALCIUM PLUS PO) Take 1 tablet by mouth daily.     cyanocobalamin 100 MCG tablet 500 mcg.     No Known Allergies    Review of Systems Constitutional: No recent fever/chills/sweats Respiratory: No recent cough/bronchitis Cardiovascular: No chest pain Gastrointestinal: No recent nausea/vomiting/diarrhea Genitourinary: No UTI symptoms Hematologic/lymphatic:No history of coagulopathy or recent blood thinner use    Objective:   Blood pressure 108/74, pulse 73, resp. rate 16, height 5\' 9"  (1.753 m), weight 223 lb 1.6 oz (101.2 kg), last menstrual period 06/29/2022, not currently breastfeeding. CONSTITUTIONAL:  Well-developed, well-nourished female in no acute distress.  HENT:  Normocephalic, atraumatic, External right and left ear normal. Oropharynx is clear and moist EYES: Conjunctivae and EOM are normal. Pupils are equal, round, and reactive to light. No scleral icterus.  NECK: Normal range of motion, supple, no masses SKIN: Skin is warm and dry. No rash noted. Not diaphoretic. No erythema. No pallor. NEUROLOGIC: Alert and oriented to person, place, and time. Normal reflexes, muscle tone coordination. No cranial nerve deficit noted. PSYCHIATRIC: Normal mood and affect. Normal behavior. Normal judgment and thought content. CARDIOVASCULAR: Normal heart rate noted, regular rhythm RESPIRATORY: Effort and breath sounds normal, no problems with respiration noted ABDOMEN: Soft, nontender, nondistended. PELVIC: Deferred MUSCULOSKELETAL: Normal range of motion. No edema and no tenderness. 2+ distal pulses.    Labs: Results for orders placed or performed during the hospital encounter of 07/31/22 (from the past 336 hour(s))  Pregnancy, urine POC   Collection Time: 07/31/22 10:49 AM  Result Value Ref Range   Preg Test, Ur NEGATIVE NEGATIVE  Results for orders placed or performed during the hospital encounter of 07/26/22 (from the past 336 hour(s))  CBC   Collection Time: 07/26/22  2:22 PM  Result Value Ref Range   WBC 7.5 4.0 - 10.5 K/uL   RBC 4.60 3.87 - 5.11 MIL/uL   Hemoglobin 12.6 12.0 - 15.0 g/dL   HCT 07/28/22 97.0 - 26.3 %   MCV 83.0 80.0 - 100.0 fL   MCH 27.4 26.0 - 34.0 pg   MCHC 33.0 30.0 - 36.0 g/dL   RDW 78.5 88.5 - 02.7 %   Platelets 348 150 - 400 K/uL   nRBC 0.0 0.0 - 0.2 %     Imaging Studies: No results found.  Assessment:    Undesired fertility   Breakthrough bleeding with IUD  Plan:   - Counseling: Patient desires permanent sterilization.  Other reversible forms of contraception were discussed with patient; she declines all other modalities. Risks of procedure discussed  with patient including but not limited to: risk of regret, permanence of method, bleeding, infection, injury to surrounding organs and need for additional procedures.  Failure risk of about 1% with increased risk of ectopic gestation if pregnancy occurs was also discussed with patient.  Also discussed possibility of post-tubal pain syndrome. Patient verbalized understanding of these risks and wants to proceed with sterilization.  Written informed consent obtained. - Preop testing ordered. - Instructions reviewed, including NPO after midnight.    GYNECOLOGY OFFICE PROCEDURE NOTE  Anne Clark is a 36 y.o. 31 here  for a Paraguard IUD removal. No GYN concerns.  Last pap smear was on 08/17/2021 and was normal.  IUD Removal  Patient identified, informed consent performed, consent signed.  Patient was in the dorsal lithotomy position, normal external genitalia was noted.  A speculum was placed in the patient's vagina, normal discharge was noted, no lesions. The cervix was visualized, no lesions, no abnormal discharge.  The strings of the IUD were grasped and pulled using ring forceps. (The strings of the IUD were not visualized, so Kelly forceps were introduced into the endometrial cavity and the IUD was grasped and removed in its entirety).  Patient tolerated the procedure well.    Patient plans for permanent sterilization. Will use barrier method until surgery next week.  Routine preventative health maintenance measures emphasized.   Hildred Laser, MD [facility]

## 2022-07-31 NOTE — Anesthesia Postprocedure Evaluation (Signed)
Anesthesia Post Note  Patient: Anne Clark  Procedure(s) Performed: LAPAROSCOPIC TUBAL LIGATION WITH FILSHIE CLIPS (Bilateral)  Patient location during evaluation: PACU Anesthesia Type: General Level of consciousness: awake and alert Pain management: pain level controlled Vital Signs Assessment: post-procedure vital signs reviewed and stable Respiratory status: spontaneous breathing, nonlabored ventilation, respiratory function stable and patient connected to nasal cannula oxygen Cardiovascular status: blood pressure returned to baseline and stable Postop Assessment: no apparent nausea or vomiting Anesthetic complications: no   No notable events documented.   Last Vitals:  Vitals:   07/31/22 1325 07/31/22 1340  BP: 117/75 125/82  Pulse: 60 (!) 53  Resp: 13 16  Temp: 36.6 C 36.9 C  SpO2: 97% 100%    Last Pain:  Vitals:   07/31/22 1340  TempSrc: Temporal  PainSc: 7                  [facility]

## 2022-07-31 NOTE — Anesthesia Procedure Notes (Signed)
Procedure Name: Intubation Date/Time: 07/31/2022 11:32 AM  Performed by: Karoline Caldwell, CRNAPre-anesthesia Checklist: Patient identified, Patient being monitored, Timeout performed, Emergency Drugs available and Suction available Patient Re-evaluated:Patient Re-evaluated prior to induction Oxygen Delivery Method: Circle system utilized Preoxygenation: Pre-oxygenation with 100% oxygen Induction Type: IV induction Ventilation: Mask ventilation without difficulty Laryngoscope Size: 3 and McGraph Grade View: Grade I Tube type: Oral Tube size: 7.0 mm Number of attempts: 1 Airway Equipment and Method: Stylet Placement Confirmation: ETT inserted through vocal cords under direct vision, positive ETCO2 and breath sounds checked- equal and bilateral Secured at: 21 cm Tube secured with: Tape Dental Injury: Teeth and Oropharynx as per pre-operative assessment

## 2022-07-31 NOTE — H&P (Signed)
  GYNECOLOGY PREOPERATIVE HISTORY AND PHYSICAL   Subjective:  Anne Clark is a 36 y.o. G5P1023 here for surgical management of undesired fertility.  Currently has Paraguard in place but is causing significant bleeding and desires removal. No significant preoperative concerns. She would like to have her Paraguard IUD removed today, instead of when she has surgery.   Proposed surgery: LAPAROSCOPIC TUBAL LIGATION    Pertinent Gynecological History: Menses: Reports premenstrual spotting 3-4 days before her cycle.  Reports cycle is now much heavier, using 2 super pads every 2 hours for the first 2-3 days. Bleeding: intermenstrual bleeding Contraception: IUD Last mammogram:  Not age appropriate:   Last pap: normal Date: 08/17/2021    Past Medical History:  Diagnosis Date   Anemia    History of 2019 novel coronavirus disease (COVID-19) 07/27/2021   Past Surgical History:  Procedure Laterality Date   DILATION AND EVACUATION  05/20/2012   missed ab   OB History  Gravida Para Term Preterm AB Living  5 3 1   2 3  SAB IAB Ectopic Multiple Live Births  2     0 3    # Outcome Date GA Lbr Len/2nd Weight Sex Delivery Anes PTL Lv  5 Term 05/18/21 [redacted]w[redacted]d 09:20 / 00:10 7 lb 10.1 oz (3.46 kg) M Vag-Spont Other  LIV  4 Para 03/12/19 [redacted]w[redacted]d  6 lb 8 oz (2.948 kg) F  None N LIV  3 Para 04/02/17 [redacted]w[redacted]d  7 lb 2 oz (3.232 kg) F Vag-Spont Other N LIV  2 SAB 2015 [redacted]w[redacted]d         1 SAB 2013 [redacted]w[redacted]d            Complications: History of D&C  Patient denies any other pertinent gynecologic issues.  Family History  Problem Relation Age of Onset   Diabetes Father    Hypertension Father    Breast cancer Paternal Aunt    Social History   Socioeconomic History   Marital status: Married    Spouse name: Abijah   Number of children: Not on file   Years of education: Not on file   Highest education level: Not on file  Occupational History   Not on file  Tobacco Use   Smoking status: Never    Smokeless tobacco: Never  Vaping Use   Vaping Use: Never used  Substance and Sexual Activity   Alcohol use: Not Currently    Alcohol/week: 1.0 standard drink of alcohol    Types: 1 Standard drinks or equivalent per week   Drug use: No   Sexual activity: Yes    Partners: Male    Birth control/protection: None, Surgical    Comment: planning  Other Topics Concern   Not on file  Social History Narrative   Not on file   Social Determinants of Health   Financial Resource Strain: Not on file  Food Insecurity: Not on file  Transportation Needs: Not on file  Physical Activity: Not on file  Stress: Not on file  Social Connections: Not on file  Intimate Partner Violence: Not on file   Current Facility-Administered Medications on File Prior to Visit  Medication Dose Route Frequency Provider Last Rate Last Admin   acetaminophen (TYLENOL) 500 MG tablet            celecoxib (CELEBREX) 200 MG capsule            famotidine (PEPCID) 20 MG tablet              gabapentin (NEURONTIN) 300 MG capsule            lactated ringers infusion   Intravenous Continuous Hildred Laser, MD       lactated ringers infusion   Intravenous Continuous Reed Breech, MD       Current Outpatient Medications on File Prior to Visit  Medication Sig Dispense Refill   Calcium-Magnesium-Vitamin D (ONE-A-DAY CALCIUM PLUS PO) Take 1 tablet by mouth daily.     cyanocobalamin 100 MCG tablet 500 mcg.     No Known Allergies    Review of Systems Constitutional: No recent fever/chills/sweats Respiratory: No recent cough/bronchitis Cardiovascular: No chest pain Gastrointestinal: No recent nausea/vomiting/diarrhea Genitourinary: No UTI symptoms Hematologic/lymphatic:No history of coagulopathy or recent blood thinner use    Objective:   Blood pressure 108/74, pulse 73, resp. rate 16, height 5\' 9"  (1.753 m), weight 223 lb 1.6 oz (101.2 kg), last menstrual period 06/29/2022, not currently breastfeeding. CONSTITUTIONAL:  Well-developed, well-nourished female in no acute distress.  HENT:  Normocephalic, atraumatic, External right and left ear normal. Oropharynx is clear and moist EYES: Conjunctivae and EOM are normal. Pupils are equal, round, and reactive to light. No scleral icterus.  NECK: Normal range of motion, supple, no masses SKIN: Skin is warm and dry. No rash noted. Not diaphoretic. No erythema. No pallor. NEUROLOGIC: Alert and oriented to person, place, and time. Normal reflexes, muscle tone coordination. No cranial nerve deficit noted. PSYCHIATRIC: Normal mood and affect. Normal behavior. Normal judgment and thought content. CARDIOVASCULAR: Normal heart rate noted, regular rhythm RESPIRATORY: Effort and breath sounds normal, no problems with respiration noted ABDOMEN: Soft, nontender, nondistended. PELVIC: Deferred MUSCULOSKELETAL: Normal range of motion. No edema and no tenderness. 2+ distal pulses.    Labs: Results for orders placed or performed during the hospital encounter of 07/31/22 (from the past 336 hour(s))  Pregnancy, urine POC   Collection Time: 07/31/22 10:49 AM  Result Value Ref Range   Preg Test, Ur NEGATIVE NEGATIVE  Results for orders placed or performed during the hospital encounter of 07/26/22 (from the past 336 hour(s))  CBC   Collection Time: 07/26/22  2:22 PM  Result Value Ref Range   WBC 7.5 4.0 - 10.5 K/uL   RBC 4.60 3.87 - 5.11 MIL/uL   Hemoglobin 12.6 12.0 - 15.0 g/dL   HCT 07/28/22 97.0 - 26.3 %   MCV 83.0 80.0 - 100.0 fL   MCH 27.4 26.0 - 34.0 pg   MCHC 33.0 30.0 - 36.0 g/dL   RDW 78.5 88.5 - 02.7 %   Platelets 348 150 - 400 K/uL   nRBC 0.0 0.0 - 0.2 %     Imaging Studies: No results found.  Assessment:    Undesired fertility   Breakthrough bleeding with IUD  Plan:   - Counseling: Patient desires permanent sterilization.  Other reversible forms of contraception were discussed with patient; she declines all other modalities. Risks of procedure discussed  with patient including but not limited to: risk of regret, permanence of method, bleeding, infection, injury to surrounding organs and need for additional procedures.  Failure risk of about 1% with increased risk of ectopic gestation if pregnancy occurs was also discussed with patient.  Also discussed possibility of post-tubal pain syndrome. Patient verbalized understanding of these risks and wants to proceed with sterilization.  Written informed consent obtained. - Preop testing ordered. - Instructions reviewed, including NPO after midnight.    GYNECOLOGY OFFICE PROCEDURE NOTE  Anne Clark is a 36 y.o. 31 here  for a Paraguard IUD removal. No GYN concerns.  Last pap smear was on 08/17/2021 and was normal.  IUD Removal  Patient identified, informed consent performed, consent signed.  Patient was in the dorsal lithotomy position, normal external genitalia was noted.  A speculum was placed in the patient's vagina, normal discharge was noted, no lesions. The cervix was visualized, no lesions, no abnormal discharge.  The strings of the IUD were grasped and pulled using ring forceps. (The strings of the IUD were not visualized, so Kelly forceps were introduced into the endometrial cavity and the IUD was grasped and removed in its entirety).  Patient tolerated the procedure well.    Patient plans for permanent sterilization. Will use barrier method until surgery next week.  Routine preventative health maintenance measures emphasized.   Hildred Laser, MD [facility]

## 2022-08-01 ENCOUNTER — Encounter: Payer: Self-pay | Admitting: Obstetrics and Gynecology

## 2022-08-02 ENCOUNTER — Encounter: Payer: BC Managed Care – PPO | Admitting: Obstetrics and Gynecology

## 2022-08-07 NOTE — Progress Notes (Unsigned)
    OBSTETRICS/GYNECOLOGY POST-OPERATIVE CLINIC VISIT  Subjective:     Anne Clark is a 36 y.o. female who presents to the clinic 1 weeks status post LAPAROSCOPIC TUBAL LIGATION WITH FILSHIE CLIPS for undesired fertility. Eating a regular diet without difficulty. Bowel movements are abnormal with constipation, she is using enema for relief (however had issues with constipation even prior to surgery)  . The patient is not having any pain. Patient only complains of soreness.   Patient would like to discuss resuming weight loss medication (was peviously on Wegovy) now that her surgery is over. Was on Wegovy over 1 year ago (prior to her last pregnancy) and was successful with weight loss.    The following portions of the patient's history were reviewed and updated as appropriate: allergies, current medications, past family history, past medical history, past social history, past surgical history, and problem list.  Review of Systems Pertinent items are noted in HPI.   Objective:   BP 107/62   Pulse 65   Ht 5\' 9"  (1.753 m)   Wt 224 lb 6.4 oz (101.8 kg)   Breastfeeding No   BMI 33.14 kg/m  Body mass index is 33.14 kg/m.  General:  alert and no distress  Abdomen: soft, bowel sounds active, non-tender  Incision:   healing well, no drainage, no erythema, no hernia, no seroma, no swelling, no dehiscence, incision well approximated. Dermabond in place.     Pathology:  None  Assessment:   Patient s/p LAPAROSCOPIC TUBAL LIGATION WITH FILSHIE CLIPS  Doing well postoperatively. Obesity (Class I)  Plan:   1. Continue any current medications as instructed by provider. Discussed use of stool softeners.  2. Wound care discussed. 3. Operative findings again reviewed. Pathology report discussed. 4. Activity restrictions: none 5. Anticipated return to work: now. 6. Obesity - patient desires to resume weight loss management. Notes that she was on Wegovy in the past, prior to her last  pregnancy. 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  Is now in a healthier mental space, has stopped her anti-depressants. Currently now taking an OTC Ashwaganda supplement. Would like to resume weight loss.  Discussed off-label use of Wegovy, current insurance restrictions. Patient would like to try and see if she will be covered again, will prescribe. Has used Phentermine in the past but notes mood swings with medication.  7. Follow up:  as needed, will need f/u in 2-3 months if [facility] approved.    [facility], MD [facility]

## 2022-08-08 ENCOUNTER — Encounter: Payer: Self-pay | Admitting: Obstetrics and Gynecology

## 2022-08-08 ENCOUNTER — Ambulatory Visit (INDEPENDENT_AMBULATORY_CARE_PROVIDER_SITE_OTHER): Payer: BC Managed Care – PPO | Admitting: Obstetrics and Gynecology

## 2022-08-08 VITALS — BP 107/62 | HR 65 | Ht 69.0 in | Wt 224.4 lb

## 2022-08-08 DIAGNOSIS — Z9851 Tubal ligation status: Secondary | ICD-10-CM

## 2022-08-08 DIAGNOSIS — E669 Obesity, unspecified: Secondary | ICD-10-CM

## 2022-08-08 DIAGNOSIS — Z4889 Encounter for other specified surgical aftercare: Secondary | ICD-10-CM

## 2022-08-08 MED ORDER — SEMAGLUTIDE-WEIGHT MANAGEMENT 2.4 MG/0.75ML ~~LOC~~ SOAJ: 2.4000 mg | SUBCUTANEOUS | 6 refills | Status: AC

## 2022-08-08 MED ORDER — SEMAGLUTIDE-WEIGHT MANAGEMENT 1 MG/0.5ML ~~LOC~~ SOAJ: 1.0000 mg | SUBCUTANEOUS | 0 refills | Status: DC

## 2022-08-08 MED ORDER — SEMAGLUTIDE-WEIGHT MANAGEMENT 0.25 MG/0.5ML ~~LOC~~ SOAJ: 0.2500 mg | SUBCUTANEOUS | 0 refills | Status: AC

## 2022-08-08 MED ORDER — SEMAGLUTIDE-WEIGHT MANAGEMENT 0.5 MG/0.5ML ~~LOC~~ SOAJ: 0.5000 mg | SUBCUTANEOUS | 0 refills | Status: AC

## 2022-08-08 MED ORDER — SEMAGLUTIDE-WEIGHT MANAGEMENT 1.7 MG/0.75ML ~~LOC~~ SOAJ: 1.7000 mg | SUBCUTANEOUS | 0 refills | Status: DC

## 2022-08-16 ENCOUNTER — Telehealth: Payer: Self-pay | Admitting: Obstetrics and Gynecology

## 2022-08-16 NOTE — Telephone Encounter (Signed)
Called pharmacy. They released medication per [facility].

## 2022-08-16 NOTE — Telephone Encounter (Signed)
[facility]  [phone]  Medication: [facility]   Pharmacists is stating that usually Suppose to start on lower and increase Pt states can start on 1 mg  They need confirmation that patient can start at 1mg .

## 2022-10-17 ENCOUNTER — Encounter: Payer: Self-pay | Admitting: Obstetrics and Gynecology

## 2022-10-17 ENCOUNTER — Other Ambulatory Visit: Payer: Self-pay

## 2022-10-21 ENCOUNTER — Other Ambulatory Visit: Payer: Self-pay | Admitting: Obstetrics and Gynecology

## 2022-11-21 ENCOUNTER — Other Ambulatory Visit: Payer: Self-pay

## 2023-05-02 ENCOUNTER — Telehealth: Payer: Self-pay

## 2023-05-04 NOTE — Telephone Encounter (Signed)
Prior authorization has been started. Waiting on patient to call with her weight.

## 2023-05-08 ENCOUNTER — Encounter: Payer: Self-pay | Admitting: Obstetrics and Gynecology

## 2023-08-15 NOTE — Patient Instructions (Signed)
Breast Self-Awareness Breast self-awareness is knowing how your breasts look and feel. You need to: Check your breasts on a regular basis. Tell your doctor about any changes. Become familiar with the look and feel of your breasts. This can help you catch a breast problem while it is still small and can be treated. You should do breast self-exams even if you have breast implants. What you need: A mirror. A well-lit room. A pillow or other soft object. How to do a breast self-exam Follow these steps to do a breast self-exam: Look for changes  Take off all the clothes above your waist. Stand in front of a mirror in a room with good lighting. Put your hands down at your sides. Compare your breasts in the mirror. Look for any difference between them, such as: A difference in shape. A difference in size. Wrinkles, dips, and bumps in one breast and not the other. Look at each breast for changes in the skin, such as: Redness. Scaly areas. Skin that has gotten thicker. Dimpling. Open sores (ulcers). Look for changes in your nipples, such as: Fluid coming out of a nipple. Fluid around a nipple. Bleeding. Dimpling. Redness. A nipple that looks pushed in (retracted), or that has changed position. Feel for changes Lie on your back. Feel each breast. To do this: Pick a breast to feel. Place a pillow under the shoulder closest to that breast. Put the arm closest to that breast behind your head. Feel the nipple area of that breast using the hand of your other arm. Feel the area with the pads of your three middle fingers by making small circles with your fingers. Use light, medium, and firm pressure. Continue the overlapping circles, moving downward over the breast. Keep making circles with your fingers. Stop when you feel your ribs. Start making circles with your fingers again, this time going upward until you reach your collarbone. Then, make circles outward across your breast and into your  armpit area. Squeeze your nipple. Check for discharge and lumps. Repeat these steps to check your other breast. Sit or stand in the tub or shower. With soapy water on your skin, feel each breast the same way you did when you were lying down. Write down what you find Writing down what you find can help you remember what to tell your doctor. Write down: What is normal for each breast. Any changes you find in each breast. These include: The kind of changes you find. A tender or painful breast. Any lump you find. Write down its size and where it is. When you last had your monthly period (menstrual cycle). General tips If you are breastfeeding, the best time to check your breasts is after you feed your baby or after you use a breast pump. If you get monthly bleeding, the best time to check your breasts is 5-7 days after your monthly cycle ends. With time, you will become comfortable with the self-exam. You will also start to know if there are changes in your breasts. Contact a doctor if: You see a change in the shape or size of your breasts or nipples. You see a change in the skin of your breast or nipples, such as red or scaly skin. You have fluid coming from your nipples that is not normal. You find a new lump or thick area. You have breast pain. You have any concerns about your breast health. Summary Breast self-awareness includes looking for changes in your breasts and feeling for changes  within your breasts. You should do breast self-awareness in front of a mirror in a well-lit room. If you get monthly periods (menstrual cycles), the best time to check your breasts is 5-7 days after your period ends. Tell your doctor about any changes you see in your breasts. Changes include changes in size, changes on the skin, painful or tender breasts, or fluid from your nipples that is not normal. This information is not intended to replace advice given to you by your health care provider. Make sure  you discuss any questions you have with your health care provider. Document Revised: 05/04/2022 Document Reviewed: 09/29/2021 Elsevier Patient Education  2024 [employer]. Preventive Care 68-49 Years Old, Female Preventive care refers to lifestyle choices and visits with your health care provider that can promote health and wellness. Preventive care visits are also called wellness exams. What can I expect for my preventive care visit? Counseling During your preventive care visit, your health care provider may ask about your: Medical history, including: Past medical problems. Family medical history. Pregnancy history. Current health, including: Menstrual cycle. Method of birth control. Emotional well-being. Home life and relationship well-being. Sexual activity and sexual health. Lifestyle, including: Alcohol, nicotine or tobacco, and drug use. Access to firearms. Diet, exercise, and sleep habits. Work and work Astronomer. Sunscreen use. Safety issues such as seatbelt and bike helmet use. Physical exam Your health care provider may check your: Height and weight. These may be used to calculate your BMI (body mass index). BMI is a measurement that tells if you are at a healthy weight. Waist circumference. This measures the distance around your waistline. This measurement also tells if you are at a healthy weight and may help predict your risk of certain diseases, such as type 2 diabetes and high blood pressure. Heart rate and blood pressure. Body temperature. Skin for abnormal spots. What immunizations do I need?  Vaccines are usually given at various ages, according to a schedule. Your health care provider will recommend vaccines for you based on your age, medical history, and lifestyle or other factors, such as travel or where you work. What tests do I need? Screening Your health care provider may recommend screening tests for certain conditions. This may include: Pelvic exam  and Pap test. Lipid and cholesterol levels. Diabetes screening. This is done by checking your blood sugar (glucose) after you have not eaten for a while (fasting). Hepatitis B test. Hepatitis C test. HIV (human immunodeficiency virus) test. STI (sexually transmitted infection) testing, if you are at risk. BRCA-related cancer screening. This may be done if you have a family history of breast, ovarian, tubal, or peritoneal cancers. Talk with your health care provider about your test results, treatment options, and if necessary, the need for more tests. Follow these instructions at home: Eating and drinking  Eat a healthy diet that includes fresh fruits and vegetables, whole grains, lean protein, and low-fat dairy products. Take vitamin and mineral supplements as recommended by your health care provider. Do not drink alcohol if: Your health care provider tells you not to drink. You are pregnant, may be pregnant, or are planning to become pregnant. If you drink alcohol: Limit how much you have to 0-1 drink a day. Know how much alcohol is in your drink. In the [place] one drink equals one 12 oz bottle of beer (355 mL), one 5 oz glass of wine (148 mL), or one 1 oz glass of hard liquor (44 mL). Lifestyle Brush your teeth every morning and  night with fluoride toothpaste. Floss one time each day. Exercise for at least 30 minutes 5 or more days each week. Do not use any products that contain nicotine or tobacco. These products include cigarettes, chewing tobacco, and vaping devices, such as e-cigarettes. If you need help quitting, ask your health care provider. Do not use drugs. If you are sexually active, practice safe sex. Use a condom or other form of protection to prevent STIs. If you do not wish to become pregnant, use a form of birth control. If you plan to become pregnant, see your health care provider for a prepregnancy visit. Find healthy ways to manage stress, such as: Meditation, yoga, or  listening to music. Journaling. Talking to a trusted person. Spending time with friends and family. Minimize exposure to UV radiation to reduce your risk of skin cancer. Safety Always wear your seat belt while driving or riding in a vehicle. Do not drive: If you have been drinking alcohol. Do not ride with someone who has been drinking. If you have been using any mind-altering substances or drugs. While texting. When you are tired or distracted. Wear a helmet and other protective equipment during sports activities. If you have firearms in your house, make sure you follow all gun safety procedures. Seek help if you have been physically or sexually abused. What's next? Go to your health care provider once a year for an annual wellness visit. Ask your health care provider how often you should have your eyes and teeth checked. Stay up to date on all vaccines. This information is not intended to replace advice given to you by your health care provider. Make sure you discuss any questions you have with your health care provider. Document Revised: 05/25/2021 Document Reviewed: 05/25/2021 Elsevier Patient Education  2024 [employer].

## 2023-08-15 NOTE — Progress Notes (Unsigned)
GYNECOLOGY ANNUAL PHYSICAL EXAM PROGRESS NOTE  Subjective:    Anne Clark is a 37 y.o. [id number] female who presents for an annual exam. The patient has no complaints today. The patient {is/is not/has never been:13135} sexually active. The patient participates in regular exercise: {yes/no/not asked:9010}. Has the patient ever been transfused or tattooed?: {yes/no/not asked:9010}. The patient reports that there {is/is not:9024} domestic violence in her life.    Menstrual History: Menarche age: 40 No LMP recorded.     Gynecologic History:  Contraception: IUD History of STI's: Denies Last Pap: 08/17/2021. Results were: normal.  Denies abnormal pap smears.  Last mammogram: Not age appropriate       OB History  Gravida Para Term Preterm AB Living  5 3 1  0 2 3  SAB IAB Ectopic Multiple Live Births  2 0 0 0 3    # Outcome Date GA Lbr Len/2nd Weight Sex Type Anes PTL Lv  5 Term 05/18/21 [redacted]w[redacted]d 09:20 / 00:10 7 lb 10.1 oz (3.46 kg) M Vag-Spont Other  LIV     Name: HAYLO, CANN     Apgar1: 8  Apgar5: 9  4 Para 03/12/19 [redacted]w[redacted]d  6 lb 8 oz (2.948 kg) F  None N LIV  3 Para 04/02/17 [redacted]w[redacted]d  7 lb 2 oz (3.232 kg) F Vag-Spont Other N LIV  2 SAB 2015 [redacted]w[redacted]d         1 SAB 2013 [redacted]w[redacted]d            Complications: History of D&C    Past Medical History:  Diagnosis Date   Anemia    History of 2019 novel coronavirus disease (COVID-19) 07/27/2021    Past Surgical History:  Procedure Laterality Date   DILATION AND EVACUATION  05/20/2012   missed ab   LAPAROSCOPIC TUBAL LIGATION Bilateral 07/31/2022   Procedure: LAPAROSCOPIC TUBAL LIGATION WITH FILSHIE CLIPS;  Surgeon: Hildred Laser, MD;  Location: [facility];  Service: Gynecology;  Laterality: Bilateral;    Family History  Problem Relation Age of Onset   Diabetes Father    Hypertension Father    Breast cancer Paternal Aunt     Social History   Socioeconomic History   Marital status: Married    Spouse name: Abijah   Number of  children: Not on file   Years of education: Not on file   Highest education level: Not on file  Occupational History   Not on file  Tobacco Use   Smoking status: Never   Smokeless tobacco: Never  Vaping Use   Vaping status: Never Used  Substance and Sexual Activity   Alcohol use: Not Currently    Alcohol/week: 1.0 standard drink of alcohol    Types: 1 Standard drinks or equivalent per week   Drug use: No   Sexual activity: Yes    Partners: Male    Birth control/protection: None, Surgical    Comment: planning  Other Topics Concern   Not on file  Social History Narrative   Not on file   Social Determinants of Health   Financial Resource Strain: Not on file  Food Insecurity: Not on file  Transportation Needs: Not on file  Physical Activity: Not on file  Stress: Not on file  Social Connections: Not on file  Intimate Partner Violence: Not on file    Current Outpatient Medications on File Prior to Visit  Medication Sig Dispense Refill   Calcium-Magnesium-Vitamin D (ONE-A-DAY CALCIUM PLUS PO) Take 1 tablet by mouth daily.  cyanocobalamin 100 MCG tablet 500 mcg.     Semaglutide-Weight Management (WEGOVY) 1.7 MG/0.75ML SOAJ INJECT 1.7MG  SUBCUTANEOUSLY ONCE A WEEK 3 mL 2   No current facility-administered medications on file prior to visit.    No Known Allergies   Review of Systems Constitutional: negative for chills, fatigue, fevers and sweats Eyes: negative for irritation, redness and visual disturbance Ears, nose, mouth, throat, and face: negative for hearing loss, nasal congestion, snoring and tinnitus Respiratory: negative for asthma, cough, sputum Cardiovascular: negative for chest pain, dyspnea, exertional chest pressure/discomfort, irregular heart beat, palpitations and syncope Gastrointestinal: negative for abdominal pain, change in bowel habits, nausea and vomiting Genitourinary: negative for abnormal menstrual periods, genital lesions, sexual problems and  vaginal discharge, dysuria and urinary incontinence Integument/breast: negative for breast lump, breast tenderness and nipple discharge Hematologic/lymphatic: negative for bleeding and easy bruising Musculoskeletal:negative for back pain and muscle weakness Neurological: negative for dizziness, headaches, vertigo and weakness Endocrine: negative for diabetic symptoms including polydipsia, polyuria and skin dryness Allergic/Immunologic: negative for hay fever and urticaria      Objective:  There were no vitals taken for this visit. There is no height or weight on file to calculate BMI.    General Appearance:    Alert, cooperative, no distress, appears stated age  Head:    Normocephalic, without obvious abnormality, atraumatic  Eyes:    PERRL, conjunctiva/corneas clear, EOM's intact, both eyes  Ears:    Normal external ear canals, both ears  Nose:   Nares normal, septum midline, mucosa normal, no drainage or sinus tenderness  Throat:   Lips, mucosa, and tongue normal; teeth and gums normal  Neck:   Supple, symmetrical, trachea midline, no adenopathy; thyroid: no enlargement/tenderness/nodules; no carotid bruit or JVD  Back:     Symmetric, no curvature, ROM normal, no CVA tenderness  Lungs:     Clear to auscultation bilaterally, respirations unlabored  Chest Wall:    No tenderness or deformity   Heart:    Regular rate and rhythm, S1 and S2 normal, no murmur, rub or gallop  Breast Exam:    No tenderness, masses, or nipple abnormality  Abdomen:     Soft, non-tender, bowel sounds active all four quadrants, no masses, no organomegaly.    Genitalia:    Pelvic:external genitalia normal, vagina without lesions, discharge, or tenderness, rectovaginal septum  normal. Cervix normal in appearance, no cervical motion tenderness, no adnexal masses or tenderness.  Uterus normal size, shape, mobile, regular contours, nontender.  Rectal:    Normal external sphincter.  No hemorrhoids appreciated. Internal  exam not done.   Extremities:   Extremities normal, atraumatic, no cyanosis or edema  Pulses:   2+ and symmetric all extremities  Skin:   Skin color, texture, turgor normal, no rashes or lesions  Lymph nodes:   Cervical, supraclavicular, and axillary nodes normal  Neurologic:   CNII-XII intact, normal strength, sensation and reflexes throughout   .  Labs:  Lab Results  Component Value Date   WBC 7.5 07/26/2022   HGB 12.6 07/26/2022   HCT 38.2 07/26/2022   MCV 83.0 07/26/2022   PLT 348 07/26/2022    Lab Results  Component Value Date   CREATININE 0.67 08/17/2021   BUN 8 08/17/2021   NA 139 08/17/2021   K 4.2 08/17/2021   CL 100 08/17/2021   CO2 24 08/17/2021    Lab Results  Component Value Date   ALT 26 08/17/2021   AST 20 08/17/2021   ALKPHOS 66 08/17/2021  BILITOT 0.3 08/17/2021    Lab Results  Component Value Date   TSH 0.581 12/30/2020     Assessment:   No diagnosis found.   Plan:  Blood tests: {blood tests:13147}. Breast self exam technique reviewed and patient encouraged to perform self-exam monthly. Contraception: IUD. Discussed healthy lifestyle modifications. Mammogram  : Not age appropriate Pap smear  UTD . COVID vaccination status: Follow up in 1 year for annual exam   Hildred Laser, MD [facility]

## 2023-08-16 ENCOUNTER — Encounter: Payer: Self-pay | Admitting: Obstetrics and Gynecology

## 2023-08-16 ENCOUNTER — Ambulatory Visit (INDEPENDENT_AMBULATORY_CARE_PROVIDER_SITE_OTHER): Payer: BC Managed Care – PPO | Admitting: Obstetrics and Gynecology

## 2023-08-16 VITALS — BP 111/68 | HR 98 | Resp 16 | Ht 69.0 in | Wt 203.4 lb

## 2023-08-16 DIAGNOSIS — E669 Obesity, unspecified: Secondary | ICD-10-CM

## 2023-08-16 DIAGNOSIS — F32A Depression, unspecified: Secondary | ICD-10-CM

## 2023-08-16 DIAGNOSIS — Z131 Encounter for screening for diabetes mellitus: Secondary | ICD-10-CM

## 2023-08-16 DIAGNOSIS — F419 Anxiety disorder, unspecified: Secondary | ICD-10-CM

## 2023-08-16 DIAGNOSIS — Z01419 Encounter for gynecological examination (general) (routine) without abnormal findings: Secondary | ICD-10-CM

## 2023-08-16 DIAGNOSIS — Z13220 Encounter for screening for lipoid disorders: Secondary | ICD-10-CM

## 2023-08-16 MED ORDER — WEGOVY 2.4 MG/0.75ML ~~LOC~~ SOAJ: 2.4000 mg | SUBCUTANEOUS | 11 refills | Status: AC

## 2023-08-17 ENCOUNTER — Encounter: Payer: Self-pay | Admitting: Obstetrics and Gynecology

## 2023-08-17 LAB — COMPREHENSIVE METABOLIC PANEL
ALT: 16 IU/L (ref 0–32)
AST: 16 IU/L (ref 0–40)
Albumin: 4.1 g/dL (ref 3.9–4.9)
Alkaline Phosphatase: 45 IU/L (ref 44–121)
BUN/Creatinine Ratio: 13 (ref 9–23)
BUN: 11 mg/dL (ref 6–20)
Bilirubin Total: 0.4 mg/dL (ref 0.0–1.2)
CO2: 23 mmol/L (ref 20–29)
Calcium: 9.4 mg/dL (ref 8.7–10.2)
Chloride: 101 mmol/L (ref 96–106)
Creatinine, Ser: 0.84 mg/dL (ref 0.57–1.00)
Globulin, Total: 3 g/dL (ref 1.5–4.5)
Glucose: 69 mg/dL — ABNORMAL LOW (ref 70–99)
Potassium: 4.4 mmol/L (ref 3.5–5.2)
Sodium: 137 mmol/L (ref 134–144)
Total Protein: 7.1 g/dL (ref 6.0–8.5)
eGFR: 92 mL/min/{1.73_m2} (ref >59)

## 2023-08-17 LAB — CBC
Hematocrit: 37.3 % (ref 34.0–46.6)
Hemoglobin: 12.7 g/dL (ref 11.1–15.9)
MCH: 29.1 pg (ref 26.6–33.0)
MCHC: 34 g/dL (ref 31.5–35.7)
MCV: 85 fL (ref 79–97)
Platelets: 358 10*3/uL (ref 150–450)
RBC: 4.37 x10E6/uL (ref 3.77–5.28)
RDW: 12.6 % (ref 11.7–15.4)
WBC: 7.4 10*3/uL (ref 3.4–10.8)

## 2023-08-17 LAB — HEMOGLOBIN A1C
Est. average glucose Bld gHb Est-mCnc: 100 mg/dL
Hgb A1c MFr Bld: 5.1 % (ref 4.8–5.6)

## 2023-08-17 LAB — LIPID PANEL
Chol/HDL Ratio: 2.4 ratio (ref 0.0–4.4)
Cholesterol, Total: 162 mg/dL (ref 100–199)
HDL: 68 mg/dL (ref >39)
LDL Chol Calc (NIH): 83 mg/dL (ref 0–99)
Triglycerides: 52 mg/dL (ref 0–149)
VLDL Cholesterol Cal: 11 mg/dL (ref 5–40)

## 2023-08-17 LAB — TSH: TSH: 1.08 u[IU]/mL (ref 0.450–4.500)

## 2023-10-20 ENCOUNTER — Other Ambulatory Visit: Payer: Self-pay

## 2023-10-22 NOTE — Telephone Encounter (Signed)
Patient should still have at least 4 more refills left as I just refilled this in September for 6 months supply

## 2023-10-22 NOTE — Telephone Encounter (Signed)
Left detailed msg on Pharmacy's voicemail.

## 2023-11-17 ENCOUNTER — Other Ambulatory Visit: Payer: Self-pay | Admitting: Obstetrics and Gynecology

## 2023-12-24 ENCOUNTER — Encounter: Payer: Self-pay | Admitting: Obstetrics and Gynecology

## 2023-12-24 DIAGNOSIS — N898 Other specified noninflammatory disorders of vagina: Secondary | ICD-10-CM

## 2023-12-25 MED ORDER — METRONIDAZOLE 500 MG PO TABS: 500.0000 mg | ORAL_TABLET | Freq: Two times a day (BID) | ORAL | 0 refills | Status: AC
